# Patient Record
Sex: Male | Born: 1955 | Race: Black or African American | Hispanic: No | Marital: Married | Smoking: Former smoker
Health system: Southern US, Community
[De-identification: ages and names within clinical notes are randomized; demographics above are authoritative.]

## PROBLEM LIST (undated history)

## (undated) ENCOUNTER — Emergency Department (HOSPITAL_COMMUNITY): Admission: EM | Payer: Self-pay | Source: Home / Self Care

## (undated) DIAGNOSIS — F319 Bipolar disorder, unspecified: Secondary | ICD-10-CM

## (undated) DIAGNOSIS — I10 Essential (primary) hypertension: Secondary | ICD-10-CM

## (undated) DIAGNOSIS — I4892 Unspecified atrial flutter: Secondary | ICD-10-CM

## (undated) DIAGNOSIS — E785 Hyperlipidemia, unspecified: Secondary | ICD-10-CM

## (undated) DIAGNOSIS — E119 Type 2 diabetes mellitus without complications: Secondary | ICD-10-CM

## (undated) HISTORY — DX: Hyperlipidemia, unspecified: E78.5

## (undated) HISTORY — PX: OTHER SURGICAL HISTORY: SHX169

## (undated) HISTORY — DX: Essential (primary) hypertension: I10

## (undated) HISTORY — DX: Type 2 diabetes mellitus without complications: E11.9

## (undated) HISTORY — DX: Bipolar disorder, unspecified: F31.9

## (undated) HISTORY — DX: Unspecified atrial flutter: I48.92

---

## 2004-10-30 ENCOUNTER — Emergency Department (HOSPITAL_COMMUNITY): Admission: EM | Admit: 2004-10-30 | Discharge: 2004-10-30 | Payer: Self-pay | Admitting: Emergency Medicine

## 2004-12-14 ENCOUNTER — Inpatient Hospital Stay (HOSPITAL_COMMUNITY): Admission: EM | Admit: 2004-12-14 | Discharge: 2004-12-16 | Payer: Self-pay | Admitting: Emergency Medicine

## 2005-03-13 ENCOUNTER — Inpatient Hospital Stay (HOSPITAL_COMMUNITY): Admission: RE | Admit: 2005-03-13 | Discharge: 2005-03-17 | Payer: Self-pay | Admitting: Psychiatry

## 2005-03-13 ENCOUNTER — Ambulatory Visit: Payer: Self-pay | Admitting: Psychiatry

## 2005-05-13 ENCOUNTER — Emergency Department (HOSPITAL_COMMUNITY): Admission: EM | Admit: 2005-05-13 | Discharge: 2005-05-13 | Payer: Self-pay | Admitting: Emergency Medicine

## 2005-06-02 ENCOUNTER — Emergency Department (HOSPITAL_COMMUNITY): Admission: EM | Admit: 2005-06-02 | Discharge: 2005-06-03 | Payer: Self-pay | Admitting: Emergency Medicine

## 2005-11-19 ENCOUNTER — Emergency Department (HOSPITAL_COMMUNITY): Admission: EM | Admit: 2005-11-19 | Discharge: 2005-11-19 | Payer: Self-pay | Admitting: Emergency Medicine

## 2006-03-19 ENCOUNTER — Emergency Department (HOSPITAL_COMMUNITY): Admission: EM | Admit: 2006-03-19 | Discharge: 2006-03-19 | Payer: Self-pay | Admitting: Emergency Medicine

## 2007-02-22 ENCOUNTER — Emergency Department (HOSPITAL_COMMUNITY): Admission: EM | Admit: 2007-02-22 | Discharge: 2007-02-22 | Payer: Self-pay | Admitting: Emergency Medicine

## 2007-03-03 ENCOUNTER — Emergency Department (HOSPITAL_COMMUNITY): Admission: EM | Admit: 2007-03-03 | Discharge: 2007-03-04 | Payer: Self-pay | Admitting: Emergency Medicine

## 2007-03-04 ENCOUNTER — Inpatient Hospital Stay (HOSPITAL_COMMUNITY): Admission: EM | Admit: 2007-03-04 | Discharge: 2007-03-06 | Payer: Self-pay | Admitting: Emergency Medicine

## 2007-03-05 ENCOUNTER — Ambulatory Visit: Payer: Self-pay | Admitting: *Deleted

## 2007-03-06 ENCOUNTER — Inpatient Hospital Stay (HOSPITAL_COMMUNITY): Admission: AD | Admit: 2007-03-06 | Discharge: 2007-03-21 | Payer: Self-pay | Admitting: *Deleted

## 2007-03-07 ENCOUNTER — Ambulatory Visit: Payer: Self-pay | Admitting: Psychiatry

## 2009-01-28 ENCOUNTER — Inpatient Hospital Stay (HOSPITAL_COMMUNITY): Admission: EM | Admit: 2009-01-28 | Discharge: 2009-01-30 | Payer: Self-pay | Admitting: Emergency Medicine

## 2009-01-29 ENCOUNTER — Encounter (INDEPENDENT_AMBULATORY_CARE_PROVIDER_SITE_OTHER): Payer: Self-pay | Admitting: Internal Medicine

## 2010-04-09 ENCOUNTER — Emergency Department (HOSPITAL_COMMUNITY): Admission: EM | Admit: 2010-04-09 | Discharge: 2010-04-09 | Payer: Self-pay | Admitting: Emergency Medicine

## 2011-02-09 LAB — CBC
HCT: 39.3 % (ref 39.0–52.0)
Hemoglobin: 13.4 g/dL (ref 13.0–17.0)
MCHC: 34.1 g/dL (ref 30.0–36.0)
MCV: 91.8 fL (ref 78.0–100.0)
Platelets: 93 10*3/uL — ABNORMAL LOW (ref 150–400)
RBC: 4.28 MIL/uL (ref 4.22–5.81)
RDW: 13.7 % (ref 11.5–15.5)
WBC: 7.6 10*3/uL (ref 4.0–10.5)

## 2011-02-09 LAB — COMPREHENSIVE METABOLIC PANEL
ALT: 42 U/L (ref 0–53)
AST: 28 U/L (ref 0–37)
Albumin: 3.5 g/dL (ref 3.5–5.2)
Alkaline Phosphatase: 55 U/L (ref 39–117)
BUN: 17 mg/dL (ref 6–23)
CO2: 29 mEq/L (ref 19–32)
Calcium: 8.7 mg/dL (ref 8.4–10.5)
Chloride: 102 mEq/L (ref 96–112)
Creatinine, Ser: 1.71 mg/dL — ABNORMAL HIGH (ref 0.4–1.5)
GFR calc Af Amer: 51 mL/min — ABNORMAL LOW (ref >60)
GFR calc non Af Amer: 42 mL/min — ABNORMAL LOW (ref >60)
Glucose, Bld: 156 mg/dL — ABNORMAL HIGH (ref 70–99)
Potassium: 3.1 mEq/L — ABNORMAL LOW (ref 3.5–5.1)
Sodium: 139 mEq/L (ref 135–145)
Total Bilirubin: 0.9 mg/dL (ref 0.3–1.2)
Total Protein: 6.5 g/dL (ref 6.0–8.3)

## 2011-02-09 LAB — RAPID URINE DRUG SCREEN, HOSP PERFORMED
Amphetamines: NOT DETECTED
Barbiturates: NOT DETECTED
Benzodiazepines: NOT DETECTED
Cocaine: NOT DETECTED
Opiates: NOT DETECTED
Tetrahydrocannabinol: NOT DETECTED

## 2011-02-09 LAB — DIFFERENTIAL
Basophils Absolute: 0 10*3/uL (ref 0.0–0.1)
Basophils Relative: 0 % (ref 0–1)
Eosinophils Absolute: 0 10*3/uL (ref 0.0–0.7)
Eosinophils Relative: 1 % (ref 0–5)
Lymphocytes Relative: 17 % (ref 12–46)
Lymphs Abs: 1.3 10*3/uL (ref 0.7–4.0)
Monocytes Absolute: 0.5 10*3/uL (ref 0.1–1.0)
Monocytes Relative: 7 % (ref 3–12)
Neutro Abs: 5.7 10*3/uL (ref 1.7–7.7)
Neutrophils Relative %: 75 % (ref 43–77)

## 2011-02-09 LAB — URINALYSIS, ROUTINE W REFLEX MICROSCOPIC
Bilirubin Urine: NEGATIVE
Glucose, UA: NEGATIVE mg/dL
Hgb urine dipstick: NEGATIVE
Ketones, ur: NEGATIVE mg/dL
Nitrite: NEGATIVE
Protein, ur: NEGATIVE mg/dL
Specific Gravity, Urine: 1.024 (ref 1.005–1.030)
Urobilinogen, UA: 0.2 mg/dL (ref 0.0–1.0)
pH: 5 (ref 5.0–8.0)

## 2011-02-09 LAB — POCT CARDIAC MARKERS
CKMB, poc: <1 ng/mL — ABNORMAL LOW (ref 1.0–8.0)
CKMB, poc: <1 ng/mL — ABNORMAL LOW (ref 1.0–8.0)
Myoglobin, poc: 158 ng/mL (ref 12–200)
Myoglobin, poc: 176 ng/mL (ref 12–200)
Troponin i, poc: <0.05 ng/mL (ref 0.00–0.09)
Troponin i, poc: <0.05 ng/mL (ref 0.00–0.09)

## 2011-02-09 LAB — ETHANOL: Alcohol, Ethyl (B): <5 mg/dL (ref 0–10)

## 2011-03-05 LAB — CBC
HCT: 34.9 % — ABNORMAL LOW (ref 39.0–52.0)
HCT: 42.7 % (ref 39.0–52.0)
Hemoglobin: 14.8 g/dL (ref 13.0–17.0)
MCHC: 34.5 g/dL (ref 30.0–36.0)
MCHC: 34.7 g/dL (ref 30.0–36.0)
MCV: 89.3 fL (ref 78.0–100.0)
MCV: 89.4 fL (ref 78.0–100.0)
Platelets: 104 10*3/uL — ABNORMAL LOW (ref 150–400)
Platelets: 115 10*3/uL — ABNORMAL LOW (ref 150–400)
RBC: 3.91 MIL/uL — ABNORMAL LOW (ref 4.22–5.81)
RBC: 4.77 MIL/uL (ref 4.22–5.81)
RDW: 13.7 % (ref 11.5–15.5)
WBC: 6.6 10*3/uL (ref 4.0–10.5)

## 2011-03-05 LAB — URINALYSIS, ROUTINE W REFLEX MICROSCOPIC
Nitrite: NEGATIVE
Specific Gravity, Urine: 1.024 (ref 1.005–1.030)
Urobilinogen, UA: 0.2 mg/dL (ref 0.0–1.0)

## 2011-03-05 LAB — COMPREHENSIVE METABOLIC PANEL
ALT: 24 U/L (ref 0–53)
AST: 23 U/L (ref 0–37)
Albumin: 3.7 g/dL (ref 3.5–5.2)
Alkaline Phosphatase: 51 U/L (ref 39–117)
BUN: 17 mg/dL (ref 6–23)
CO2: 27 mEq/L (ref 19–32)
Calcium: 9 mg/dL (ref 8.4–10.5)
Chloride: 105 mEq/L (ref 96–112)
Creatinine, Ser: 1.44 mg/dL (ref 0.4–1.5)
GFR calc Af Amer: >60 mL/min (ref >60)
GFR calc non Af Amer: 51 mL/min — ABNORMAL LOW (ref >60)
Glucose, Bld: 145 mg/dL — ABNORMAL HIGH (ref 70–99)
Potassium: 3.7 mEq/L (ref 3.5–5.1)
Sodium: 140 mEq/L (ref 135–145)
Total Bilirubin: 0.9 mg/dL (ref 0.3–1.2)
Total Protein: 6.7 g/dL (ref 6.0–8.3)

## 2011-03-05 LAB — DIFFERENTIAL
Basophils Absolute: 0 10*3/uL (ref 0.0–0.1)
Basophils Relative: 0 % (ref 0–1)
Eosinophils Absolute: 0 10*3/uL (ref 0.0–0.7)
Eosinophils Relative: 1 % (ref 0–5)
Lymphocytes Relative: 15 % (ref 12–46)
Lymphs Abs: 1 10*3/uL (ref 0.7–4.0)
Monocytes Absolute: 0.3 10*3/uL (ref 0.1–1.0)
Monocytes Relative: 4 % (ref 3–12)
Neutro Abs: 5.2 10*3/uL (ref 1.7–7.7)
Neutrophils Relative %: 80 % — ABNORMAL HIGH (ref 43–77)

## 2011-03-05 LAB — BASIC METABOLIC PANEL
BUN: 12 mg/dL (ref 6–23)
Calcium: 8.7 mg/dL (ref 8.4–10.5)
Creatinine, Ser: 1.26 mg/dL (ref 0.4–1.5)
GFR calc Af Amer: >60 mL/min (ref >60)

## 2011-03-05 LAB — GLUCOSE, CAPILLARY
Glucose-Capillary: 115 mg/dL — ABNORMAL HIGH (ref 70–99)
Glucose-Capillary: 154 mg/dL — ABNORMAL HIGH (ref 70–99)
Glucose-Capillary: 58 mg/dL — ABNORMAL LOW (ref 70–99)
Glucose-Capillary: 89 mg/dL (ref 70–99)
Glucose-Capillary: 91 mg/dL (ref 70–99)
Glucose-Capillary: 99 mg/dL (ref 70–99)

## 2011-03-05 LAB — CARDIAC PANEL(CRET KIN+CKTOT+MB+TROPI)
CK, MB: 2.3 ng/mL (ref 0.3–4.0)
Total CK: 167 U/L (ref 7–232)
Total CK: 182 U/L (ref 7–232)
Troponin I: 0.01 ng/mL (ref 0.00–0.06)
Troponin I: 0.03 ng/mL (ref 0.00–0.06)

## 2011-03-05 LAB — RAPID URINE DRUG SCREEN, HOSP PERFORMED
Barbiturates: NOT DETECTED
Opiates: NOT DETECTED

## 2011-04-07 NOTE — H&P (Signed)
NAMEBRAXDON, GAPPA              ACCOUNT NO.:  [id number]   MEDICAL RECORD NO.:  [id number]          PATIENT TYPE:  INP   LOCATION:  4703                         FACILITY:  [facility]   PHYSICIAN:  [name], M.D.    DATE OF BIRTH:  07/21/1956   DATE OF ADMISSION:  01/28/2009  DATE OF DISCHARGE:                              HISTORY & PHYSICAL   PRESENTING COMPLAINTS:  Poorly responsive.   HISTORY OF PRESENT ILLNESS:  [name] is a 55 year old African  American gentleman with past medical history significant for psychotic  disorder, systemic hypertension, and type 2 diabetes mellitus.  He was  brought in to the emergency room by EMS after his wife found him poorly  responsive in the bathroom in the early hours of the morning of  presentation.  He apparently had his eyes rolled back and did not  respond to name calling for some minutes and then he came around.  There  was no complaints of any chest pain, shortness of breath, or  palpitations.  He did not have any slurring of speech or weakness of  extremities.  There is no report of any seizure activity and he did not  have any vomiting, diarrhea, or abdominal pain.  His sugar levels were  not checked, so he was not sure if he was hypoglycemic, but it was noted  that he had eaten and took his medications, was picked up by EMS.   His vital signs were stable at the emergency room.  He was not in any  acute respiratory or painful distress.  He was alert, oriented, and not  responsive appropriately.  His vital signs are stable.   LABORATORY WORKUP:  CT scan of the head and blood work, all of which  came back within normal limits, however, EKG showed new onset of atrial  fibrillation at a rate of 94 beats per minute, although he was  completely asymptomatic of this.  He was admitted to the hospital to  further evaluate his symptomatology and new findings on EKG.   PAST MEDICAL HISTORY:  1. Psychotic disorder.  2. Type 2 diabetes  mellitus.  3. Systemic hypertension.  4. Anxiety disorder.   MEDICATION HISTORY:  1. He is on NovoLog insulin 70/30, taking 20 units in the a.m. and 15      units in the p.m.  2. Lotrel 5/10 mg 1 p.o. daily.  3. Benicar HCT 40/12.5 once a day.  4. Aspirin 81 mg daily.  5. Amaryl 4 mg daily.  6. Geodon 60 mg 1 p.o. q.a.m. 2 at bedtime.  7. Haldol 5 mg 1 p.o. b.i.d.  8. Depakote ER 1 p.o. at bedtime.   ALLERGIES:  He has no known drug allergies.   FAMILY AND SOCIAL HISTORY:  He is married and lives with his wife.  He  denies any use of alcohol, tobacco, or illicit drugs.  He has 11  siblings, 1 with kidney disease and 1 with diabetes.  There are no  children.  Both of his parents are deceased.  He is disabled from his  medical conditions.  REVIEW OF SYSTEMS:  Essentially as above.   PHYSICAL EXAMINATION:  GENERAL:  He was lying in the hospital bed, not  in acute respiratory or painful distress.  He is not pale.  He is not  icteric.  He is not cyanosed.  VITAL SIGNS:  Blood pressure of 118/68; heart rate of 71, regular;  respiratory rate of 18; temperature 97.7; and O2 sats on room air was  96%.  NECK:  Supple with no elevated JVD.  No cervical lymphadenopathy.  CHEST:  Good air entry bilaterally with no rales, rhonchi, or wheezes.  HEART:  S1 and S2 are heard.  No murmurs.  No S3 gallops.  Regular.  ABDOMEN:  Flat, soft, and nontender.  No masses.  Bowel sounds are  present.  EXTREMITIES:  No edema, calf tenderness, or swelling.  CNS:  He is alert and oriented x3 with no focal neurological deficits.   Sodium is 140, potassium 3.7, bicarbonate of 27, chloride 105, glucose  145, BUN 17, and creatinine 1.44.  Liver function tests within normal  limits.  White count is 6.6, hemoglobin 13.8, hematocrit 42.7, and  platelet count of 115,000.  CT scan of the chest was negative for acute  pulmonary embolism and his lungs are clear.  CT scan of the head is  reported as showing normal  appearance with no malformation, atrophy,  infarct, mass lesion, hemorrhage, or hydrocephalus.  His EKG showed  atrial fibrillation rhythm at a rate of 94 beats per minute.   ADMISSION DIAGNOSES:  1. Presyncope.  2. New-onset atrial fibrillation.  3. Type 2 diabetes mellitus.   PLAN OF CARE:  Admit to telemetry bed.   DIET:  A 2 g sodium carb-modified medium diet.   Vital signs q.4 h.  CBGs a.c and nightly.  Further laboratory data  including a CBC, BMET, EKG in a.m., serial CPK and troponin q. 6. x2  with IV normal saline less than 5 mL an hour, Protonix 40 mg daily for  GI prophylaxis, subcu Lovenox 40 mg daily for DVT prophylaxis,  metoprolol 12.5 mg b.i.d. for rate control.  His home medication will be  continued as above.  Further treatment plan would depend on his response  to the above therapy and results of further evaluation.  A 2-D  echocardiogram will be performed to evaluate cardiac muscle, valves, and  ejection fraction.  This plan of care has been discussed with him and  his wife, and their questions were answered.      [name], M.D.  Electronically Signed     EA/MEDQ  D:  01/29/2009  T:  01/30/2009  Job:  [id number]

## 2011-04-10 NOTE — Discharge Summary (Signed)
NAMEGEROD, [name]              ACCOUNT NO.:  [id number]   MEDICAL RECORD NO.:  [id number]          PATIENT TYPE:  INP   LOCATION:  0355                         FACILITY:  [facility]   PHYSICIAN:  Michaelyn Barter, M.D. DATE OF BIRTH:  05/06/1956   DATE OF ADMISSION:  12/12/2004  DATE OF DISCHARGE:  12/16/2004                                 DISCHARGE SUMMARY   PRIMARY CARE PHYSICIAN:  Robyn N. Allyne Gee, M.D.   FINAL DIAGNOSES:  1.  Psychotic disorder not otherwise specified.  2.  Renal insufficiency.  3.  Uncontrolled hypertension.   CONSULTATION:  Psychiatry, Antonietta Breach, M.D.   HISTORY OF PRESENT ILLNESS:  [name] is a 55 year old gentlemen with a  past medical history of psychiatric problems and diabetes mellitus who was  admitted secondary to a chief complaint of confusion, hallucinations and  agitation.  At the time of admission his wife provided the history.  She  stated that the patient had been stressed secondary to the death of his  brother which occurred four months earlier.  He had not been able to work  over the previous two to three weeks.  He had been experiencing daily  hallucinations.  In addition, he has become more confused and agitated,  talking to himself and other individuals who are not present in the room.  At times, he remained awake throughout all hours of the night and appeared  to be suffering from sleep deprivation.  The patient had recently been  started on Xanax 0.5 mg q.h.s. and Benicar 40 mg daily by his PCP, Dr.  Allyne Gee.  There were complaints of fevers, chills, nausea, vomiting, or  diarrhea.  No chest pain or shortness of breath.   PAST MEDICAL HISTORY:  1.  Psychotic episode in the 1990s at which time the patient was      hospitalized by at [facility] for approximately three months.  2.  Type 2 diabetes mellitus diagnosed approximately 10 years ago.  3.  Hypertension.  4.  Anxiety.   ALLERGIES:  No known drug allergies.   SOCIAL  HISTORY:  Cigarettes:  Denies.  Alcohol:  Denies.   FAMILY HISTORY:  Mother deceased at the age of 63 from complications  secondary to diabetes mellitus.  Father deceased, cause of death unknown.  Brother died at age 88 from complications of congestive heart failure.   HOSPITAL COURSE:  The patient was initially suppose to transfer to [facility], however, his lab results revealed a BUN of 28 and a creatinine of  1.8.  Therefore, the patient was admitted for medical clearance with a  tentative plan to later transfer to [facility].   The patient was subsequently admitted to the medicine floor.  His  antihypertensive medications were initially stopped.  He was provided with  aggressive IV fluid hydration initially with normal saline at a rate of 200  mL an hour which was subsequently decreased to a rate of 100 mL an hour.   PROBLEM #1 -  PSYCHOSIS:  Appeared to improve over the course of his  hospitalization.  He did not display overt  signs of psychotic behavior over  the course of his hospitalization.  Psychiatry was consulted and again Dr.  Jeanie Sewer was the physician who responded.  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  He concluded that the patient was no longer at  risk for passive neglect, harming himself or others and he and the patient  made a plan that the patient should dial 911 if those problems did arise.  He recommended that the patient be continued on Zyprexa 5 mg q.h.s. and that  the manage care arrange for the patient to follow up as an outpatient with  the [facility] psychiatric outpatient facilities.   PROBLEM #2 -  UNCONTROLLED HYPERTENSION:  The patient's blood pressure was  elevated over the course of his hospitalization.  This was most likely  secondary to the stopping of his antihypertensive medications which were  done secondary to his creatinine  being elevated.  It was not clear which  medication precipitated the elevation of his creatinine, therefore neither  medication was initially started but instead, the patient was started on  Norvasc, 2.5 mg initially; however, his blood pressure remained elevated  with that dose, therefore, the dose was titrated up to 7.5 mg p.o. daily. At  the date of discharge, the patient's blood pressure was still elevated,  however, the patient stated that he had a follow-up appointment to see Dr.  Candyce Churn. Sanders the same day of discharge.  Therefore, further management  of that could be achieved during that appointment.   PROBLEM #3 -  RENAL INSUFFICIENCY:  This most likely was precipitated by the  patient's decreased oral consumption of fluids over the few days that lead  to his current hospitalization.  Likewise, this decreased oral consumption  of fluids in the presence of hydrochlorothiazide more than likely  precipitated his elevation of creatinine.  This is supported by the fact  that the patient's creatinine dropped to normal levels with IV fluid  hydration.  The patient was also started, however, on Benicar by his PCP  approximately two days prior to this admission.  There was some  consideration that the Benicar could have also precipitated this event,  however, it is questionable that the patient would have had such a  remarkable normalization of his creatinine if this were the case.  Again,  the patient will follow up with his PCP for further evaluation of his  situation.   PROBLEM #4 -  DIABETES MELLITUS:  The patient was continued on sliding scale  insulin and  Accu-Checks were performed over the course of his  hospitalization.  His sugars remained in the normal to slightly elevated  range.   CONDITION ON DISCHARGE:  Improved.   The patient will be discharged home today on the following medications:  1.  Norvasc 2.5 mg three tablets p.o. daily. 2.  Zyprexa 5 mg one tablet  q.h.s.   He has been instructed to follow up with his primary care physician, Dr.  Dorothyann Peng, today.  The appointment had been previously scheduled at  approximately 2 o'clock p.m. today.  At that time, there can be further  adjustments  in his blood pressure medications.  He is also instructed to follow up with  [facility] on Friday, December 19, 2004, at 12:30 p.m.  with Dr. Mikey Bussing.  The phone number there is [phone].  He is  to report to  Intake located at [address].      OR/MEDQ  D:  12/16/2004  T:  12/16/2004  Job:  [id number]   cc:   Candyce Churn. Allyne Gee, M.D.  [address]  [address]  [place]  [place] [address]  Fax: [phone]

## 2011-04-10 NOTE — Discharge Summary (Signed)
[name], FRANSSEN NO.:  [id number]   MEDICAL RECORD NO.:  [id number]          PATIENT TYPE:  IPS   LOCATION:  0402                          [facility]ACILITY[facility]  [facility]   PHYSICIAN:  Jasmine Pang, M.D. DATE OF BIRTH:  28-May-1956   DATE OF ADMISSION:  03/06/2007  DATE OF DISCHARGE:  03/21/2007                               DISCHARGE SUMMARY   IDENTIFICATION:  This is a 55 year old African-American male who was  married.  He was admitted on a voluntary basis on March 06, 2007.   HISTORY OF PRESENT ILLNESS:  This patient was transferred to [facility] after an admission to the medical unit on April 11.  He  had been brought to the emergency room by his wife for increasing  problems with confusion and agitation.  He was admitted to the medical  unit for stabilization of his diabetes and hypertension, and was  restarted on his room routine medications by his primary care physician.  Psychiatric consult was done.  The patient was restarted on Risperdal 1  mg twice daily.  He had been off psychotropic meds for an unknown period  of time.  He was unwilling to give history today.  Affect was guarded.  He has been redirectable.  There are no clear suicidal or homicidal  thoughts expressed.  The patient was fairly catatonic when seen by the  psychiatrist in the hospital.  The patient was previously followed at  [facility].  He is not currently under the  care of any psychiatrist.  This is his third admission to [facility] with his last admission being March 13, 2005  through March 17, 2005, under the care of Dr. Geoffery Lyons.  He has a  history of prior psychotic illness with agitation and at times some  bizarre behavior, and diagnosed in the past with psychotic disorder NOS.  Possible bipolar disorder with psychotic features.  He was previously  stabilized on Risperdal.  He has a history of reported substance  abuse  with the last known use prior to this admission being 80.  His urine  drug screen was positive for marijuana and benzodiazepines.  The patient  has diabetes mellitus type 2, hypertension.  He also has a past medical  history of osteoarthritis and chronic renal insufficiency which is  currently under control.  He is on Risperdal 1 mg p.o. b.i.d., Ativan 1  mg q.4 hours p.r.n. agitation or withdrawal, NovoLog 70/30 insulin 10  units q.a.m. and 5 units q.p.m., Amaryl 4 mg daily, Lotrel 5/10 once  daily, Benicar HCT 20/12.5 mg once daily, Protonix 40 mg daily.  He had  no known drug allergies.   PHYSICAL FINDINGS:  The patient was a well-nourished, well-developed  male in no acute distress.  His physical exam was done while in the  hospital and [facility].   ADMISSION LABORATORY:  CBC was grossly within normal limits except for a  low hematocrit of 38.2 (39 to 52) and a low RBC count of 4.15 (4.22 to  5.81).  Neutrophil count was  high at 81 (43 to 77).  Glucose is 200 (70  to 99).  Routine chemistry panel was within normal limits other than the  elevated glucose.  An ammonia level was within normal range.  TSH was  1.038 which was within normal limits.  Acetaminophen level was less than  10.  Salicylate was less than 4.  Urine drug screen was positive for  benzodiazepines and for marijuana.   HOSPITAL COURSE:  Upon admission, patient was started on Risperdal M tab  1 mg now and Ativan 2 mg p.o. or IM now.  He was started on Risperdal M  tab 1 mg p.o. b.i.d., Ativan 1 mg p.o. q.4 hours p.r.n. agitation,  NovoLog 70/30 mixed 10 units in the a.m. and  5 units in the p.m.,  Amaryl 4 mg p.o. daily, Lotrel 5/10 p.o. daily, Benicar HCT 20/12.5 mg  once daily, Protonix 40 mg daily.  On March 07, 2007, Risperdal M tab  was changed to 1 mg in the morning and 1 mg at h.s. instead of b.i.d.,  Cogentin 2 mg q.8 hour p.r.n. EPS akathisia was started.  The 70/30  insulin was discontinued.  He  was to have no basal insulin since he was  not eating very well.  He was placed on a sliding scale insulin dose.  Risperdal 1 mg p.o. q.6 hours, M-Tab p.r.n. agitation was started,  Ambien 5 mg p.o. nightly may repeat x1 was started.  On March 08, 2007,  Risperdal M-Tab was increased to 1 mg p.o. q.a.m. and 3 mg p.o. nightly.  On March 09, 2007, Risperdal M-Tab was increased to 1 mg p.o. b.i.d. and  3 mg p.o. nightly.  On March 10, 2007, Risperdal was discontinued and  Geodon 80 mg p.o. b.i.d. with meals was started.  On March 12, 2007, the  patient was started on Ensure p.o. q.i.d.  On March 14, 2007, Geodon was  increased to 80 mg in the morning and 120 mg at h.s.  On March 16, 2007,  Haldol 5 mg p.o. b.i.d. was begun.  On March 16, 2007, Depakote ER 500  mg p.o. nightly was started due to reports that he had a history of  bipolar disorder.  The a.m. Depakote level done on March 19, 2007 was  27.5 (50 to 100).   Upon first meeting with the patient, he was catatonic.  He would not  talk with me.  He stared straight ahead.  His mental status continued  like this for the following week.  Risperdal was slowly increased as  indicated above, but on March 10, 2007, this was discontinued and he was  changed to Geodon 80 mg p.o. b.i.d.  On March 11, 2007, the patient was  sitting in the chair.  He was somewhat catatonic but he did tell me he  knew my name was Dr. Katrinka Blazing.  He said very still as I attempted to talk  with him.  His wife came daily to help feed him and he took food well  from her.  On March 12, 2007, the patient had the same mental status.  On March 13, 2007, the patient had good eye contact.  He was lying in  bed.  He spoke with me today.  He states he is not sleeping well.  He  did not eat breakfast.  He was not sure if his wife was going to visit  today.  He was less psychomotor retarded, but still lying in bed.  He  did not want to get up for lunch.  In the next few days the  patient's mental status was essentially unchanged.  He continued to have better  eye contact.  He was able to speak.  He was eating breakfast.  Wife was  still visiting him frequently.  He was still incontinent and wearing a  diaper, but he was assisted to the toilet every 2 hours and was able to  no longer be incontinent.  On March 17, 2007, the patient was taking his  medications without problem.  He was eating and toileting appropriately.  On April 25,2008, the patient had gotten out of bed to eat meals and ate  lunch in the cafeteria with his wife.  There was still some psychomotor  retardation.  He was toileting on his own.  Wife felt he would be ready  to go home after the weekend.  She agreed with a Monday March 21, 2007  discharged.  On April 26 and March 20, 2007, patient's mental status  continued to be improved.  He was alert and cooperative.  He was up and  visible on the unit.  He was eating meals.  Speech was more spontaneous.  He reports he slept well.   On March 21, 2007, mental status had improved markedly from admission  status.  The patient had good eye contact.  Speech was normal rate and  flow.  He was more conversant, cooperative.  Psychomotor retardation was  still present to some extent, but not as severe as had been upon  admission when he was catatonic.  The mood was euthymic.  Affect still  somewhat constricted but able to express some range.  There was no  suicidal or homicidal ideation.  No thoughts of self injurious behavior.  No auditory or visual hallucinations.  No paranoia or delusions.  Thoughts were logical and goal-directed.  Thought content no predominant  theme.  Cognitive was back to baseline.  The patient was no longer  catatonic.   DISCHARGE DIAGNOSES:  AXIS I:  Bipolar disorder, recurrent severe with  psychotic features.  History of substance abuse.  Current marijuana  abuse. AXIS II:  None.  AXIS III:  Diabetes mellitus type 2, controlled;  hypertension; history  of osteoarthritis; history of chronic renal insufficiency which is well  controlled.  AXIS IV:  Moderate (burden of psychiatric illness, burden of medical  illness, psychosocial difficulties related to his illness).  AXIS V:  Global assessment of functioning upon discharge was 48.  Global  assessment of functioning upon admission was 30.  Global assessment of  functioning highest past year was 64.   DISCHARGE/PLAN:  There were no specific activity level or dietary  restrictions.   DISCHARGE MEDICATIONS:  1. Amaryl 4 mg daily at 7:00 a.m. before meal.  2. Protonix 40 mg daily at 8:00 a.m. with breakfast.  3. Benicar HCT 20/12.5 daily.  4. Lotrel 05/10 daily.  5. Geodon 60 mg in the morning and 120 mg at dinner (this was      decreased from 80 mg in the morning).  6. Haldol 5 mg twice daily.  7. Depakote ER 500 mg p.o. nightly.  A.M. Depakote level was 27.5 (50      to 100).  8. Ambien 10 mg p.o. nightly.  POST HOSPITAL CARE PLANS:  The patient is to go to the [facility]  to see Dr. Mikey Bussing on March 22, 2007 at 3:15 p.m.  The patient is also  to see  his primary care practitioner, Dr. [name], for his  diabetes and hypertension.      Jasmine Pang, M.D.  Electronically Signed     [facility]/MEDQ  D:  03/21/2007  T:  03/21/2007  Job:  [id number]

## 2011-04-10 NOTE — Discharge Summary (Signed)
NAMEDEVYNN, SCHEFF              ACCOUNT NO.:  [id number]   MEDICAL RECORD NO.:  [id number]          PATIENT TYPE:  INP   LOCATION:  1320                         FACILITY:  [facility]   PHYSICIAN:  [name], M.D.    DATE OF BIRTH:  Aug 10, 1956   DATE OF ADMISSION:  03/04/2007  DATE OF DISCHARGE:  03/08/2007                               DISCHARGE SUMMARY   HISTORY OF PRESENT ILLNESS:  Mr. Willhite is a 55 year old [place]-American  gentleman with past medical history significant for psychotic disorder,  bipolar, type 2 diabetes mellitus and hypertension.  He was admitted via  the emergency room at [facility], after requiring visits to  the ED, brought in by his wife with complaints of confusion, agitation,  restlessness, turmoil, poor sleep and not answering questions  appropriately.  He had been on medications for psychosis in the past but  have been off his medications for a couple of years and had been  drinking alcohol and smoking marijuana.  He was becoming increasingly  difficult for the wife to cope with him at home; therefore requested  that he be admitted for further evaluation and treatment.  As a result  of his comorbid medical conditions, he was admitted under my service to  stabilize his diabetes and hypertension and possible transfer to the  [facility].   HOSPITAL COURSE:  On admission, patient looked anxious and kind of  suspicious.  He was not in any acute respiratory or painful distress.  His vital signs were stable.  His blood pressure was elevated at 208/96,  heart rate of 106, regular respiratory rate of 24.  Temperature was 99,  O2 SATs on room air was 99%.  His weight was 73.7 kg with a height of 70  inches.  His other hospital physical exam essentially was normal, except  in the psychiatric exam his affect was flat.  He was not answering  questions but had to look to his wife, for he answered in short  syllables.  He was not making full  eye contact.  I could not say if he  had any hallucinations or delusions.   LABORATORY:  His initial laboratory data revealed  normal CBC, CMET and  a CT scan of his head.  His ammonia levels were 16.  Drug screen was  negative, except for benzodiazepines, cannabinoids in his urine.   ADMISSION DIAGNOSES:  1. Psychotic disorder.  2. Uncontrolled diabetes mellitus.  3. Uncontrolled hypertension, all due to poor compliance with physical      therapy.   On admission, patient was placed on a medical bed.  He was started back  on his usual medications of Amaryl, NovoLog 70/30 and mixed insulin,  Benicar HCT and Lotrel.  At time, patient did not take his medications  as recommended, except if his wife is available to coerce him and  requires him to take it.   Psychiatric consult was requested and patient was kindly seen by Dr.  Dub Mikes, who resumed him back on his Risperdal 1 mg b.i.d., as well as  Ativan 1 mg q.4 h. p.r.n.  for agitation.   EXAMINATION:  GENERAL:  Today, he seen lying in bed, not in acute  respiratory or painful distress.  He still looks quite anxious and  suspicious, again not responding to questioning appropriately.  VITAL SIGNS:  Shows a blood pressure of 160/90, heart rate of 98  regular.  CHEST:  Clear to auscultation, heart sounds 1/2 are heard.  ABDOMEN:  Benign.  Bowel sounds are present.  EXTREMITIES:  Shows no edema, no calf tenderness or swelling.  CNS:  Alert and oriented.  There are no focal neurological deficits.   ASSESSMENT:  Mr. Lemmons has a acute exacerbation of his chronic  psychotic disorder.  His medical conditions are currently stable, then  they get maybe aggravated by his psychotic condition.  I believe that he  will be well served or better managed for his psychiatric condition at  the [facility].   __________  stable for discharge to the [facility] today.   DISCHARGE DIAGNOSES:  1. Psychotic disorder.  2.  Polysubstance abuse.  3. Type 2 diabetes mellitus, moderately controlled.  4. Hypertension, moderately controlled.   DISCHARGE MEDICATIONS:  1. Risperdal 1 mg b.i.d.  2. Ativan 1 mg q.4 h. p.r.n. for agitation.  3. NovoLog 70/30 mixed 10 units a.m., 5 units p.m.  4. Amaryl 4 mg daily.  5. Lotrel 5/10 mg once a day.  6. Benicar HCT 20/12.5 mg once a day.  7. Protonix 40 mg daily.   His plan of care has been discussed with him and his wife and their  questions answered.   DISPOSITION:  [facility].  Condition on discharge is  stable.      [name], M.D.  Electronically Signed     EA/MEDQ  D:  03/06/2007  T:  03/06/2007  Job:  [id number]

## 2011-04-10 NOTE — H&P (Signed)
NAMETHAYDEN, [name] NO.:  [id number]   MEDICAL RECORD NO.:  [id number]          PATIENT TYPE:  EMS   LOCATION:  ED                           FACILITY:  [name]   PHYSICIAN:  [name], M.D.    DATE OF BIRTH:  December 08, 1955   DATE OF ADMISSION:  12/12/2004  DATE OF DISCHARGE:                                HISTORY & PHYSICAL   PRIMARY CARE PHYSICIAN:  [name].   CHIEF COMPLAINT:  Confusion, hallucinations, agitation.   HISTORY OF PRESENT ILLNESS:  [name] is a 55 year old man with a past  medical history significant for diabetes mellitus, hypertension, and a 3-  month hospitalization at [name] in the 1990s for psychosis who  presents to the [facility] with a 3- to 4-day history of confusion, agitation,  visual hallucinations, and lack of sleep.  Most of the history is provided  by the patient's wife, [name].  The patient has been stressed out  since his brother died 4 months ago.  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  He has had daily episodes of visual  hallucinations per his wife.  He has become confused and agitated, often  talking to himself and talking to other individuals who are not in the room.  The patient has not been verbally or physically abusive to his wife.  At  times the patient is up all night pacing the room.  He has probably had only  a total of 6 or 7 hours of sleep over the past 4-5 nights.  There has been  no episodes of low blood sugars or very high blood sugars.  The patient  denies any alcohol, illicit drug use, or tobacco use.  The only new  medications have been Xanax 0.5 at bedtime and Benicar 40 mg daily  prescribed by his relatively new primary care physician, [name].  The  patient has had no recent fever, chills, nausea, vomiting, or diarrhea.  His  appetite has been fair to poor.  He denies any abdominal pain.  No chest  pain, no shortness of breath.  No headaches,  no visual changes.  The patient  had a similar presentation in the 1990s which required a 48-month  hospitalization at [name].  When asked, neither the patient nor his  wife were clear on the diagnoses that he was given at the time.   The patient was evaluated by the behavioral health team in the emergency  department.  The tentative diagnosis was psychosis.  It was felt by the  behavioral health staff that the patient was appropriate for transfer to  [name].  However, the representatives at [name] did not accept the patient because of his elevated BUN of 28 and  creatinine of 1.8.  The patient will therefore be admitted for 23-hour  observation for medical clearance and hopefully transfer to [name]  later on in the day.  His renal function tests will be reassessed after IV  fluids.   PAST MEDICAL HISTORY:  1.  History of some type of psychotic behavior in the 1990s, hospitalized at      [name] for 3 months.  Exact diagnosis unknown.  2.  Type 2 diabetes mellitus, diagnosed approximately 10 years ago.  3.  Hypertension.  4.  Anxiety.   MEDICATIONS:  1.  Insulin 70/30 15 units b.i.d.  2.  Xanax 0.5 mg q.h.s. (recently started).  3.  Hydrochlorothiazide 25 mg daily.  4.  Benicar 40 mg daily (started 2 days ago).  5.  Aspirin 81 mg daily.  6.  Robitussin DM two teaspoons q.6h. as needed (the patient has not used in      2 weeks).   ALLERGIES:  No known drug allergies.   SOCIAL HISTORY:  The patient is married and has been married 11 years.  He  and his wife live in [place], [place].  No children.  He denies  alcohol, tobacco, and drug use.  He is employed as a Education administrator.   FAMILY HISTORY:  His mother died at ? 7 years of age secondary to  complications from diabetes mellitus.  His father is also deceased, age  unknown, cause unknown.  His brother died approximately 3-4 months ago at 56  years of age secondary to congestive  heart failure with a history of  diabetes and hypertension.   REVIEW OF SYSTEMS:  The patient's review of systems is otherwise negative.   PHYSICAL EXAMINATION:  VITAL SIGNS:  Temperature 98.1, blood pressure  110/64, pulse 73, respiratory rate 18, oxygen saturation 99% on room air.  GENERAL:  The patient is a well-built, muscular 55 year old African-American  man who is currently lying in bed in no acute distress.  HEENT:  Head is normocephalic, nontraumatic.  Pupils are equally round and  react to light.  Extraocular movements are intact.  Conjunctivae are clear,  sclerae are white.  Tympanic membranes are clear bilaterally.  Nasal mucosa  is mildly dry.  No drainage, no sinus tenderness.  Oropharynx reveals mildly  dry mucous membranes, no posterior exudates or erythema.  NECK:  Supple, no thyromegaly, no bruit, no JVD, no adenopathy.  LUNGS:  Clear to auscultation bilaterally.  Breathing is nonlabored.  HEART:  S1, S2 with no murmurs, rubs, or gallops.  ABDOMEN:  Positive bowel sounds, soft, nontender, nondistended.  No  hepatosplenomegaly.  EXTREMITIES:  No acute joint abnormalities.  Pedal pulses are 2+  bilaterally.  No pretibial edema, no pedal edema.  NEUROLOGIC:  The patient is alert and oriented to himself, his wife, and the  [facility].  He is not oriented to the year, the date, the President, the city  or the state.  His speech appears appropriate, no dysarthria.  Cranial  nerves II-XII are intact.  Strength is 5/5 throughout, sensation is intact.  PSYCHOLOGICAL:  The patient's affect is flat.  He tries to answer questions;  however, he looks to his wife for virtually every answer.  He sometimes has  evidence of answering other questions that were not asked.  No evidence of  visual or auditory hallucinations during the exam.  The patient denies  visual hallucinations.  ADMISSION LABORATORY DATA:  CT scan of the head revealed no acute disease.  WBC 9.0, hemoglobin 13.9,  hematocrit 40.8, platelets 183.  Alcohol 7.  Sodium 137, potassium 3.4, chloride 108, CO2 22, glucose 154, BUN 28,  creatinine 1.8, calcium 9.1.  Urine drug screen was positive for  benzodiazepines; otherwise negative.   ASSESSMENT:  1.  Psychotic behavior with delirium.  The patient has a psychiatric      history, exact diagnosis is unknown.  Question possible bipolar disorder      with psychosis versus schizophrenia.  The patient was somewhat combative      earlier in the emergency department but was given Geodon and has now      calmed down some.  2.  Mild azotemia with a BUN of 28 and a creatinine of 1.8.  The patient was      seen in the emergency department in December 2005.  At that time his BUN      was 14 and his creatinine was 1.3.  The patient has been prescribed      hydrochlorothiazide and Benicar for blood pressure control.  These      medications may have contributed to the mild azotemia.  Also, the      patient probably has not hydrated himself over the past few days      secondary to lack of sleep and psychosis.  3.  Hypokalemia.  The patient's hypokalemia is probably secondary to the      hydrochlorothiazide.   PLAN:  1.  The patient will be admitted for 23-hour observation.  2.  Will treat the patient with IV fluids x1 L and then 100 mL/hour for      volume repletion.  3.  Will reassess the patient's renal function panel later on today.  If the      renal function panel is within normal limits or shows some improvement,      will reconsult psychiatry for transfer for psychiatric treatment and      management.  4.  Will replete potassium by mouth and in IV fluids.  5.  Will assess the patient's TSH, RPR, and vitamin B12.  6.  Zyprexa 5 mg daily - dose now.  Will also treat with as-needed Geodon      and Ativan for increase in agitation.  7.  Neurologic checks q.4h.  8.  Cover blood sugars with a sliding scale insulin regimen.     Deni   DF/MEDQ  D:   12/13/2004  T:  12/13/2004  Job:  [id number]   cc:   Candyce Churn. Allyne [facility], M.D.  [address]  [address]  [place]  [place] [address]  Fax: [phone]
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
## 2011-04-10 NOTE — H&P (Signed)
[name]              ACCOUNT NO.:  [id number]   MEDICAL RECORD NO.:  [id number]          PATIENT TYPE:  INP   LOCATION:  1320                         FACILITY:  [facility]   PHYSICIAN:  [name], M.D.    DATE OF BIRTH:  Nov 04, 1956   DATE OF ADMISSION:  03/04/2007  DATE OF DISCHARGE:                              HISTORY & PHYSICAL   PRESENTING COMPLAINT:  Tremulous, confusion and agitation.   HISTORY OF PRESENT ILLNESS:  [name] is a 55-year African American  gentleman with past medical history significant for type 2 diabetes  mellitus, hypertension, bipolar disorder, psychosis.  He presented to  the emergency room three times in the last 2 weeks with the wife  complaining of recurrent episodes of confusion, poor sleep,  unresponsiveness and tremulousness and agitation.  He had been seen in  the emergency room a couple of times, thought to have a flare of his  psychotic disorder and had been referred to mental health, but he had  not made it to these appointments yet.  He had been treated with  antipsychotic medications concluding Zyprexa, Seroquel and Risperdal in  the past but apparently stopped taking all his medications about 2 years  ago and has been drinking alcohol heavily and also using marijuana on  and off.  He was seen in my office about 10 days ago and restarted back  on his Seroquel as well as Ativan and advised to follow up with a  psychiatrist, but again he did not make it to this appointment and he  presents back to the hospital again with recurrent lapses in  responsiveness where just stares and becomes very tremulous in his arms.  He does not pass out.  He does not fall.  He does not have any foaming  of his mouth.  He does not have any biting of his tongue or  incontinence.  His wife states that he then breaks out of this and  becomes responsive again and talks to her, but he has been having these  episodes on and off.  The wife stated that when he  was taking his Ativan  and Seroquel, it was making him excessively sleepy and when he does not  take it, he seems to be unable to sleep, pacing the room, and cannot  keep him still.  He has to have somebody with him at home all the time  to keep an eye on him and monitor him, and the wife, being a full-time  student, cannot continue to care for him at home.  Review of his old  records shows that he had a similar presentation to the hospital in  January 2006 with confusion, hallucinations and agitation, and this was  thought again to be due to his exacerbation of psychosis.  I was called  to admit him for medical clearance prior to evaluation by the  psychiatric team, which I think he will need to stabilize him.   PAST MEDICAL HISTORY:  1. 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  He has been admitted at [facility]  in the past.  2. Type 2 diabetes mellitus diagnosed about 12 years ago.  3. Systemic hypertension.  4. Anxiety disorder.   MEDICATION HISTORY:  1. He is on NovoLog insulin 70/30 10 units b.i.d.  2. He is on Benicar 20/12.5 mg once a day.  3. Lotrel 5/10 mg once daily.  4. Aspirin 81 mg daily.  5. Ambien 10 mg q.h.s.  6. Seroquel 100 mg q.h.s.  7. Ativan 1 mg b.i.d. p.r.n. for agitation.   ALLERGIES:  He has no known drug allergies.   SOCIAL HISTORY:  He is married and lives with his wife here in  [place].  He has no children.  He is he is a Education administrator by trade but had  not been able to work for many years because of his symptoms and is he  virtually disabled.   FAMILY HISTORY:  His parents are both deceased.  His mother had  diabetes.  His brother died about 2 years ago from congestive heart  failure and had a history of hypertension and diabetes.   REVIEW OF SYSTEMS:  Unable to obtain from the patient on.   PHYSICAL EXAMINATION:  GENERAL:  He is lying comfortably on the hospital  bed, not in acute respiratory or painful distress.  He seems to move his   legs now and again, but he is not tremulous or having any seizures.  He  is alert and seems to be oriented to himself.  He is unable to answer  questions related to other orientation to time or place.  He is not  pale.  He is not icteric.  He is not cyanosed.  He is well-hydrated.  VITAL SIGNS:  His blood pressure on admission was 208/96, heart rate 106  and regular, respiratory rate of 24, temperature 99, O2 saturation on  room air is 99%.  His weight is 73.7 kg, height of 70 inches.  HEENT:  His pupils are equal and reactive to light and accommodation.  NECK:  Supple with no elevated JVD.  No carotid bruit.  No cervical  lymphadenopathy.  CHEST:  Good air entry bilaterally with no rales, rhonchi or wheezes.  CARDIAC:  Heart sounds S1 and S2 are heard with no murmurs, S3 gallops.  ABDOMEN:  Flat, soft, nontender, no masses.  Bowel sounds are present.  EXTREMITIES:  No edema.  No calf tenderness or swelling.  CENTRAL NERVOUS SYSTEM:  He is alert and oriented to himself.  The  cranial nerves II-XII are intact.  He is moving all extremities  normally.  There is no focal neurological deficit.  PSYCHIATRIC:  His affect is flat.  He looks to his wife to try and  answer questions but only answers in short syllables.  He is unable to  maintain full eye contact.  I cannot say if he is having any  hallucinations or delusions.   LABORATORY DATA:  A CT scan of his head on March 03, 2007, was normal.  Laboratory data shows a white count of 7.7, hemoglobin 13.4, hematocrit  39, platelet count of 467.  PT was 13.5, INR 1.0, PTT 20.  Sodium was  143, potassium 3.9, chloride 107, bicarbonate 27, BUN is 21, creatinine  1.43, glucose is 233.  His liver function test performed on March 03, 2007, essentially was normal except for albumin of 3.2.  Ammonia level  was 16.  Tylenol and salicylate levels, alcohol levels were within normal limits.  Urine drug screen was positive for benzodiazepines and  cannabinoids.  Urinalysis was positive for glucose, bilirubin, ketones,  blood and protein.   ASSESSMENT:  [name] is a 55 year old African American  gentleman with past medical history significant for hypertension,  diabetes, psychotic disorder, question schizophrenia/slash bipolar  affective disorder.  Presented to the emergency room with some form of a  personality change in the form of drug and substance abuse, staring  episodes and tremulousness, which is atypical for him.  He has been off  his psychiatric medications for several years and his condition seemed  to have deteriorated.  His medical conditions are currently  uncontrolled, most likely due to poor compliance, but he has not been  seen by his psychiatrist for many years and I suspect he needs to be  back on his medications.   ADMISSION DIAGNOSES:  1. Personality disorder.  2. Psychotic disorder.  3. Polysubstance abuse.  4. Uncontrolled type 2 diabetes mellitus.  5. Uncontrolled hypertension.   PLAN OF CARE:  The patient will be admitted to a medical bed.  He will  be on a 3 g sodium, carb-modified diet.  CBGs will checked a.c. and h.s.  and covered with sliding scale NovoLog insulin per moderately sensitive  protocol.  He will be on IV fluids with normal saline at 55 mL/hr.,  Protonix 40 mg daily, Ativan 1-2 mg q.4h. p.r.n. IV for agitation,  Risperdal 1 mg b.i.d.  Further laboratory tests will include an EKG and  a TSH.  Psychiatric consult requested in the a.m. for evaluation and  appropriate therapy once he is medically stable.  This plan of care has  been discussed with him and his wife and their questions were answered.      [name], M.D.  Electronically Signed     EA/MEDQ  D:  03/05/2007  T:  03/05/2007  Job:  [id number]

## 2011-04-10 NOTE — H&P (Signed)
NAMEMARILYN, [name] NO.:  [id number]   MEDICAL RECORD NO.:  [id number]          PATIENT TYPE:  IPS   LOCATION:  0402                          FACILITY:  [facility]   PHYSICIAN:  [name], M.D. DATE OF BIRTH:  1955/12/31   DATE OF ADMISSION:  03/06/2007  DATE OF DISCHARGE:                       PSYCHIATRIC ADMISSION ASSESSMENT   IDENTIFICATION:  This a 55 year old African-American male who is  married.  This is a voluntary admission.   HISTORY OF PRESENT ILLNESS:  This patient was transferred to behavioral  health center after an admission to the medical unit on April 11, after  being brought to the emergency room by his wife for increasing problems  with confusion and agitation.  He was admitted to the medical unit for  stabilization of his diabetes and hypertension, and was restarted on his  routine medications by his primary care physician.  Psychiatric consult  was done.  The patient was restarted on his Risperdal 1 mg twice daily.  He has been off his psychotropic medications for an unknown period of  time.  He is unwilling to give any history today.  Affect is guarded.  He has been directable.  No clear suicidal or homicidal thoughts  expressed.   PAST PSYCHIATRIC HISTORY:  The patient was previously followed at  [facility].  He is not currently under the care of  any psychiatrist.  This is his third admission to [facility], with his last admission being March 13, 2005, through  March 17, 2005, under the care of Dr. Geoffery Flores.  He has a history of  prior psychotic illness with agitation and at times some bizarre  behavior, and diagnosed in the past with psychotic disorder NOS,  possible bipolar disorder with psychotic features.  Previously  stabilized on Risperdal.  He has a history of reported substance abuse  with last known use prior to this admission being 42.  His urine drug  screen was positive for  marijuana and benzodiazepines.   SOCIAL HISTORY:  Unemployed African-American male who is married.  Wife's name is [name].  He is currently unemployed.  He has a  history of a psychosocial stressors in the past.  Precipitating  admissions including financial issues with unemployment and bills piling  up.  Any current stressors are unclear.   FAMILY HISTORY:  Not available.   MEDICAL HISTORY:  The patient is followed by [name], M.D., his  primary care practitioner.  Medical problems are:  1. Diabetes mellitus type 2.  2. Hypertension.  3. He also has a past medical history of osteoarthritis.  4. Chronic renal insufficiency which is currently under control.   CURRENT MEDICATIONS:  His current medications were prescribed over on  the inpatient medical unit and he was discharged on:  1. Risperdal 1 mg p.o. b.i.d.  2. Ativan 1 mg every 4 hours p.r.n. for agitation or withdrawal.  3. NovoLog 70/30 insulin 10 units q. a.m. and 5 units q. p.m.  4. Amaryl 4 mg daily.  5. Lotrel 5/10 mg once daily.  6. Benicar hydrochlorothiazide 20/12.5  mg once daily.  7. Protonix 40 mg daily.   DRUG ALLERGIES:  None.   POSITIVE PHYSICAL FINDINGS:  GENERAL:  Well-nourished, well-developed  male in no distress.  VITAL SIGNS:  When he was initially seen in the emergency room,  he was  found to be afebrile, pulse 113, respirations 24, blood pressure 209/92.  He was stabilized on his medications.  On admission to the unit, he was  noted to be 163 pounds, 5 feet 10 inches, temperature 97, pulse 77,  respirations 16, blood pressure 150/94.  CBGs use have ranged from a  high of 196 mg percent down to 54 mg percent today at 12 noon.   MENTAL STATUS EXAM:  Healthy appearing but withdrawn African-American  male, fully alert, guarded affect, reclusive.  Poor eye contact.  Basically not speaking.  Has been directable today.  Attending meals.  No acting out or bizarre behavior but not very responsive  verbally.  Did  some mumbling when we asked him if he would be willing to talk but his  speech was not coherent.  He appears to be somewhat internally  stimulated.  Thought process:  We are unable to evaluate.  He does  appear guarded and psychotic, possibly internally stimulated.  Mood is  guarded.  Cognition:  He has indicated that he is oriented to person and  basic situation.  Has taken his medicines from the nurses and motor  behavior is appropriate.   DIAGNOSTIC STUDIES:  CBC:  WBC 6.8, hemoglobin 13.1, hematocrit 38.2,  platelets 175,000.  His INR was 1.  Chemistries:  Sodium 143, potassium  3.7, chloride 110, carbon dioxide 27, BUN 16, creatinine 1.12.  Liver  enzymes were all within normal limits.  SGOT 19, SGPT 21, alkaline  phosphatase was 62 and total bilirubin 0.7.  His total albumin was  somewhat decreased at 3.2, calcium normal at 9.4.  TSH 1.038.  Ammonia  level 16.  Acetaminophen and salicylate were unremarkable.  Urine drug  screen positive for benzodiazepines and marijuana.  His routine  urinalysis did reveal 100 mg/dL of protein, no leukocytes or remarkable  cells.   AXIS I:  Psychosis not otherwise specified, rule out bipolar disorder  with psychotic features.  Distant history of substance abuse, current  marijuana use.  AXIS II:  Deferred.  AXIS III:  Diabetes mellitus 2, controlled.  Hypertension.  AXIS IV:  Deferred.  AXIS V:  Current 30. past year 76.   PLAN:  Voluntarily admit the patient with every 15-minute checks in  place.  To control his psychosis, we have started back on his Risperdal  1 mg M-Tabs q. a.m. and nightly and 1 mg every 6 hours p.r.n. for any  agitation, and have made available Cogentin 2 mg every 8 hours p.r.n.  either IM or p.o. for any EPS or echophrasia, but he has not had to use  any Cogentin at this time.  Ambien 5 mg nightly p.r.n. for insomnia and that may be repeated once, and Ativan 2 mg IM or p.o. every 6 hours  p.r.n. for  agitation.  We will not use any IM meds unless he gets  severely agitated and refuses orals.  We have had a phone conference  with Dr. Concepcion Flores, his primary care physician, about his falling CBGs and  we will discontinue his 70/30 insulin at this time and continue him only  on oral agents at the instruction of Dr. Concepcion Flores.   Estimated length of stay is 7 days.  We are going to try to get a family  conference with his wife as he makes progress.      Margaret A. Lorin Picket, N.P.      [name], M.D.  Electronically Signed    MAS/MEDQ  D:  03/07/2007  T:  03/08/2007  Job:  [id number]

## 2012-01-28 IMAGING — CT CT HEAD W/O CM
1 series · 16 of 30 positions shown, 20 images · non-contrast
Comparison: 01/28/2009

CLINICAL DATA: Near-syncope

CT HEAD WITHOUT CONTRAST
TECHNIQUE: Contiguous axial images were obtained from the base of
the skull through the vertex without contrast.

[Series 2: head routine 4.8 h37s · axial · 0.45mm/px · z∈[-116,+17]mm · 16 of 30 slices shown, 20 images]
[im 2/30  brain]
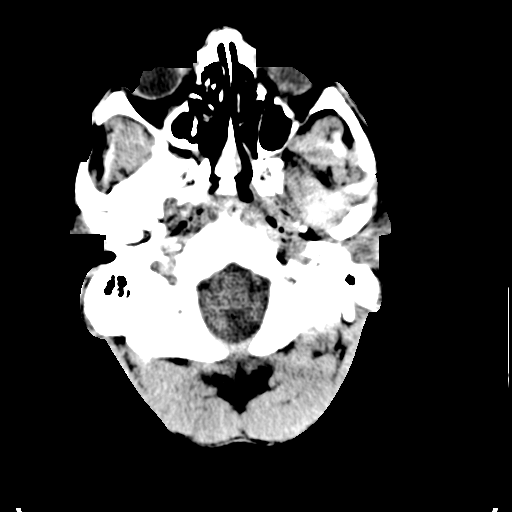
[im 2/30  bone]
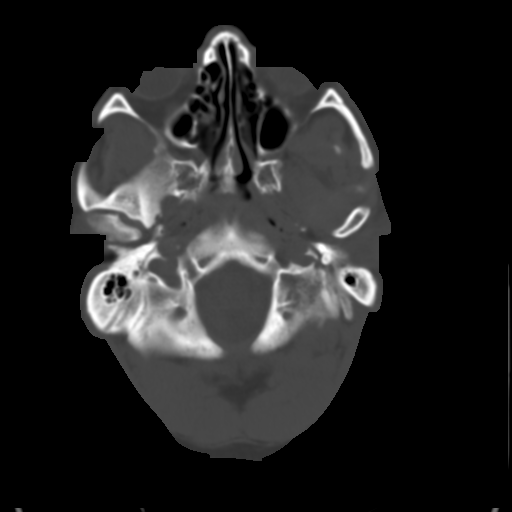
[im 4/30  brain]
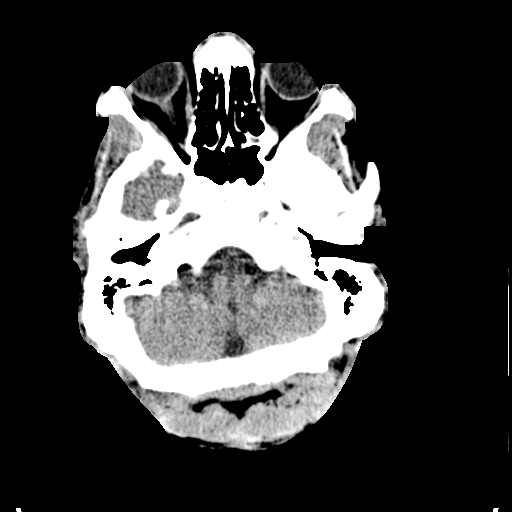
[im 6/30  brain]
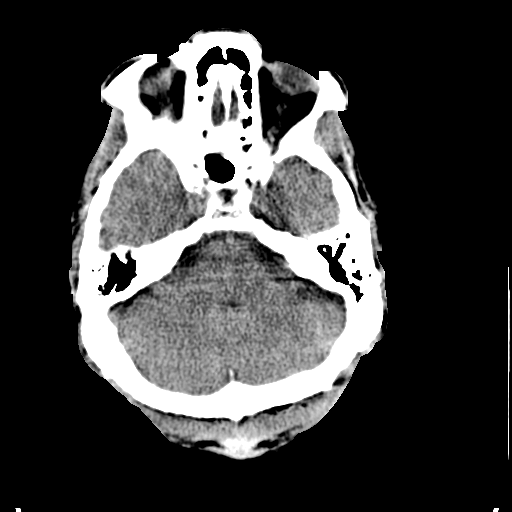
[im 8/30  brain]
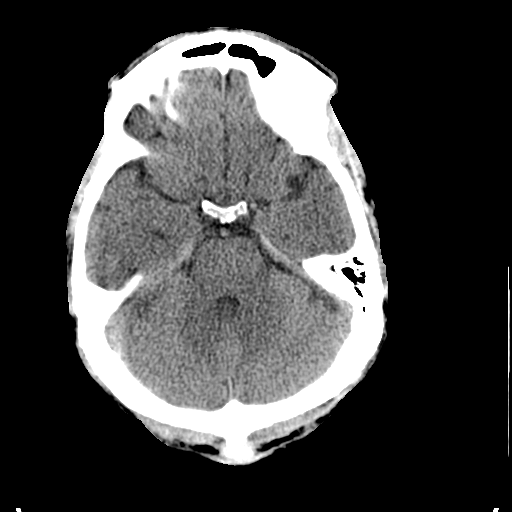
[im 9/30  brain]
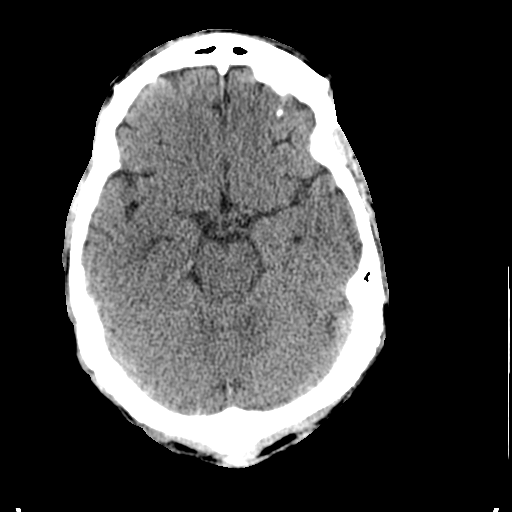
[im 9/30  bone]
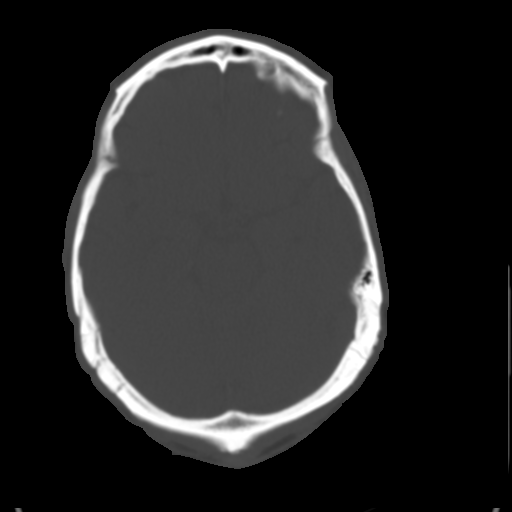
[im 11/30  brain]
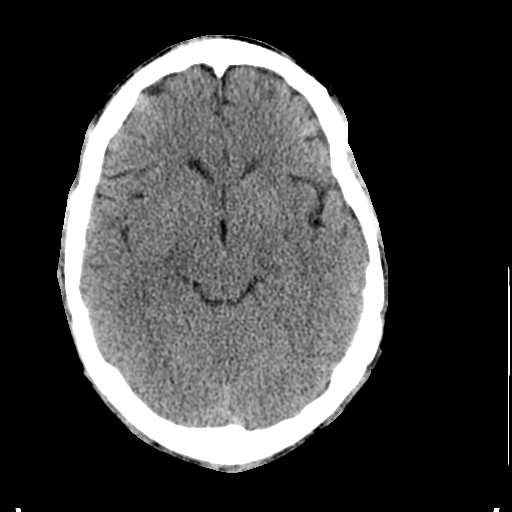
[im 13/30  brain]
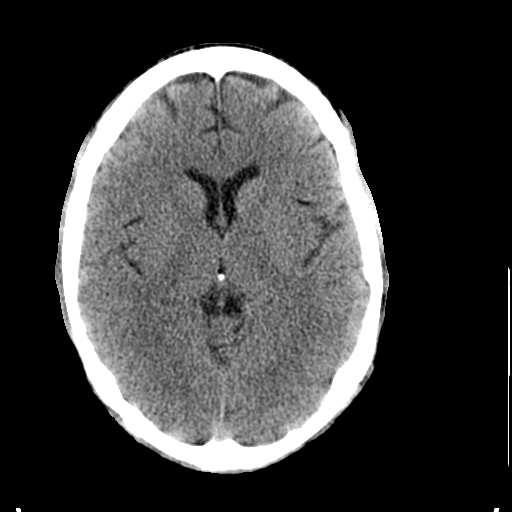
[im 15/30  brain]
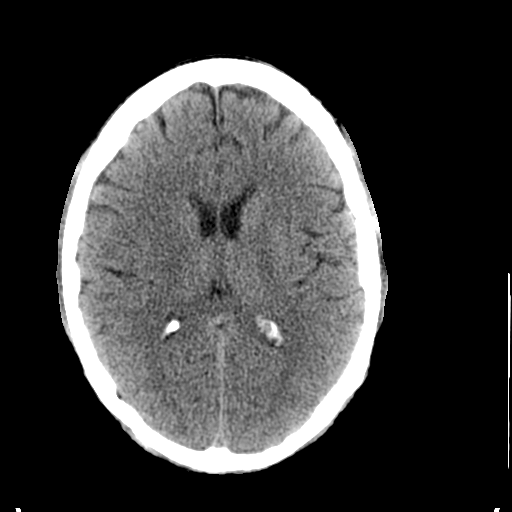
[im 16/30  brain]
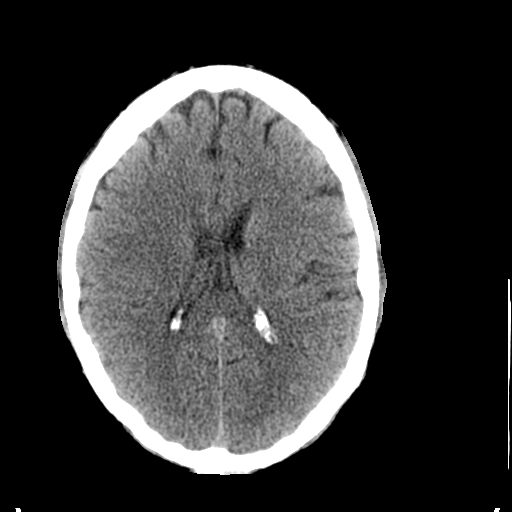
[im 16/30  bone]
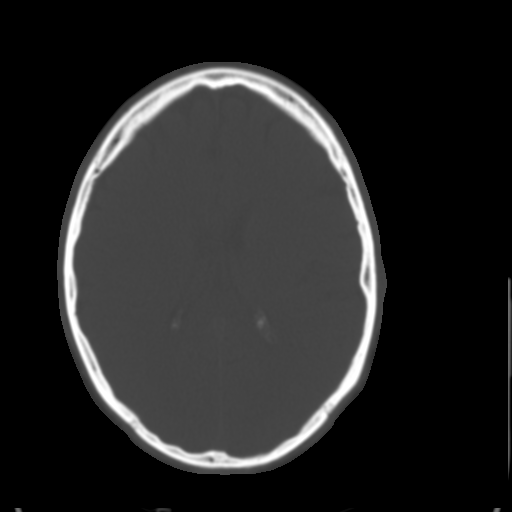
[im 18/30  brain]
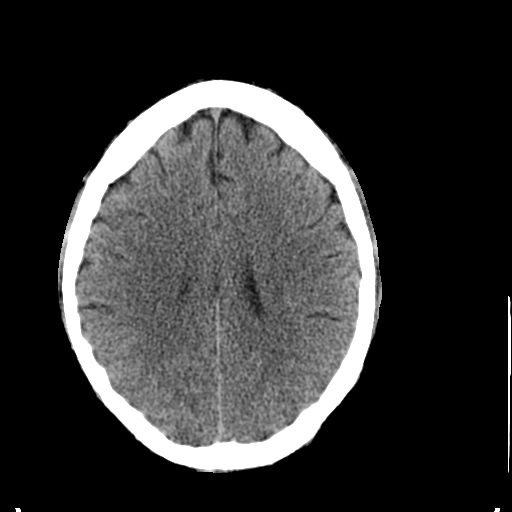
[im 20/30  brain]
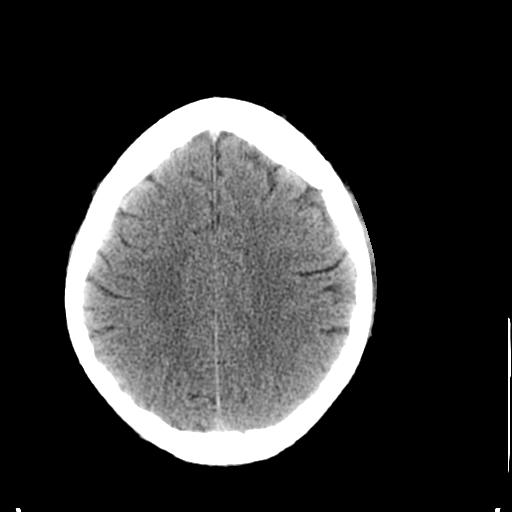
[im 22/30  brain]
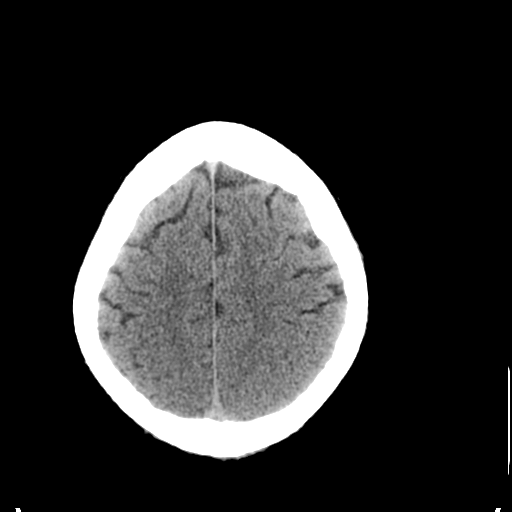
[im 23/30  brain]
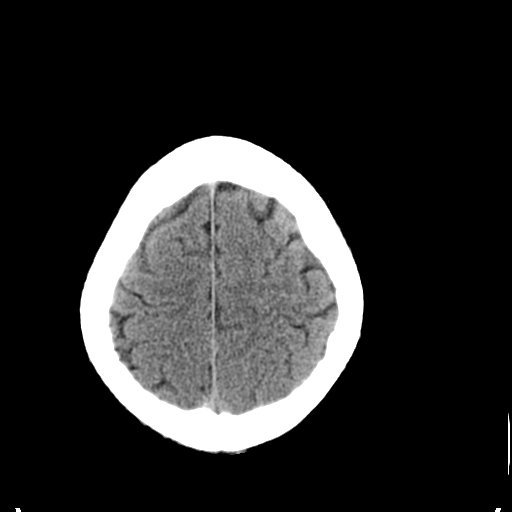
[im 23/30  bone]
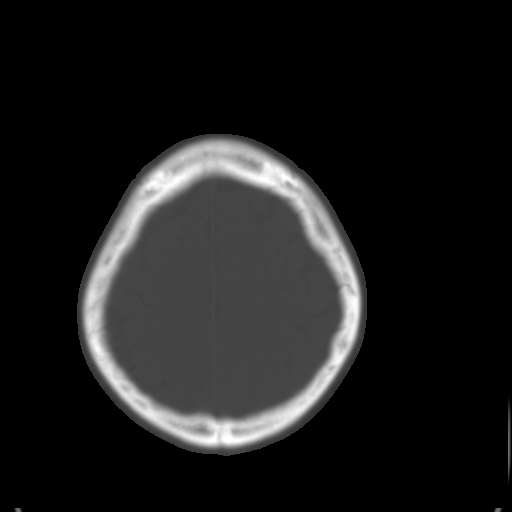
[im 25/30  brain]
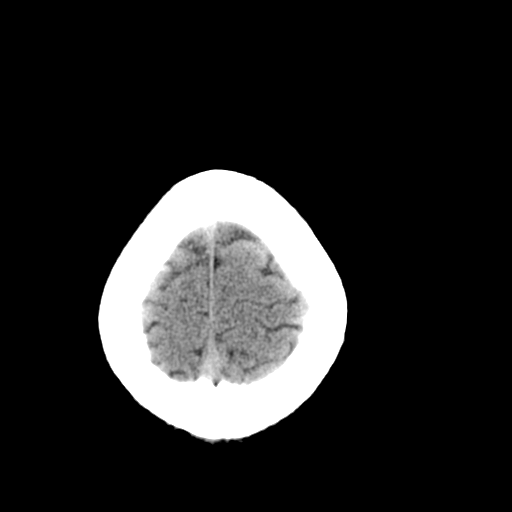
[im 27/30  brain]
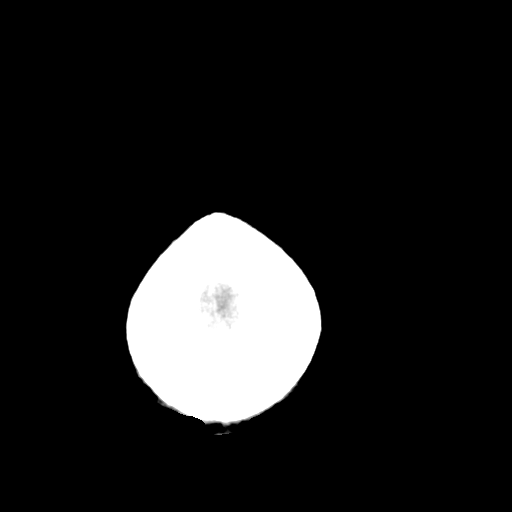
[im 29/30  brain]
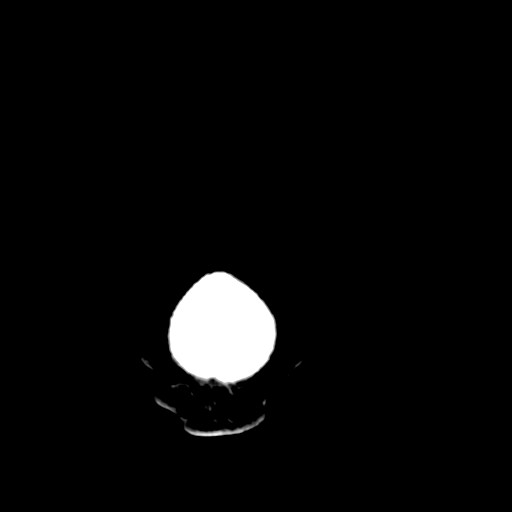

[16 of 30 positions shown; findings below may reference images not displayed]

FINDINGS: Ventricular size and CSF spaces normal.

 No evidence for acute infarct, hemorrhage, or mass lesion. No
extra-axial fluid collections or midline shift.  Calvarium intact.
No fluid in the sinuses visualized.
IMPRESSION: No acute or significant findings.

## 2012-01-28 IMAGING — CR DG CHEST 2V
2 series · 2 of 2 positions shown · non-contrast
Comparison: CT chest and chest x-ray 01/28/2009

CLINICAL DATA: Near syncope, weakness.

CHEST - 2 VIEW

[w chest pa]
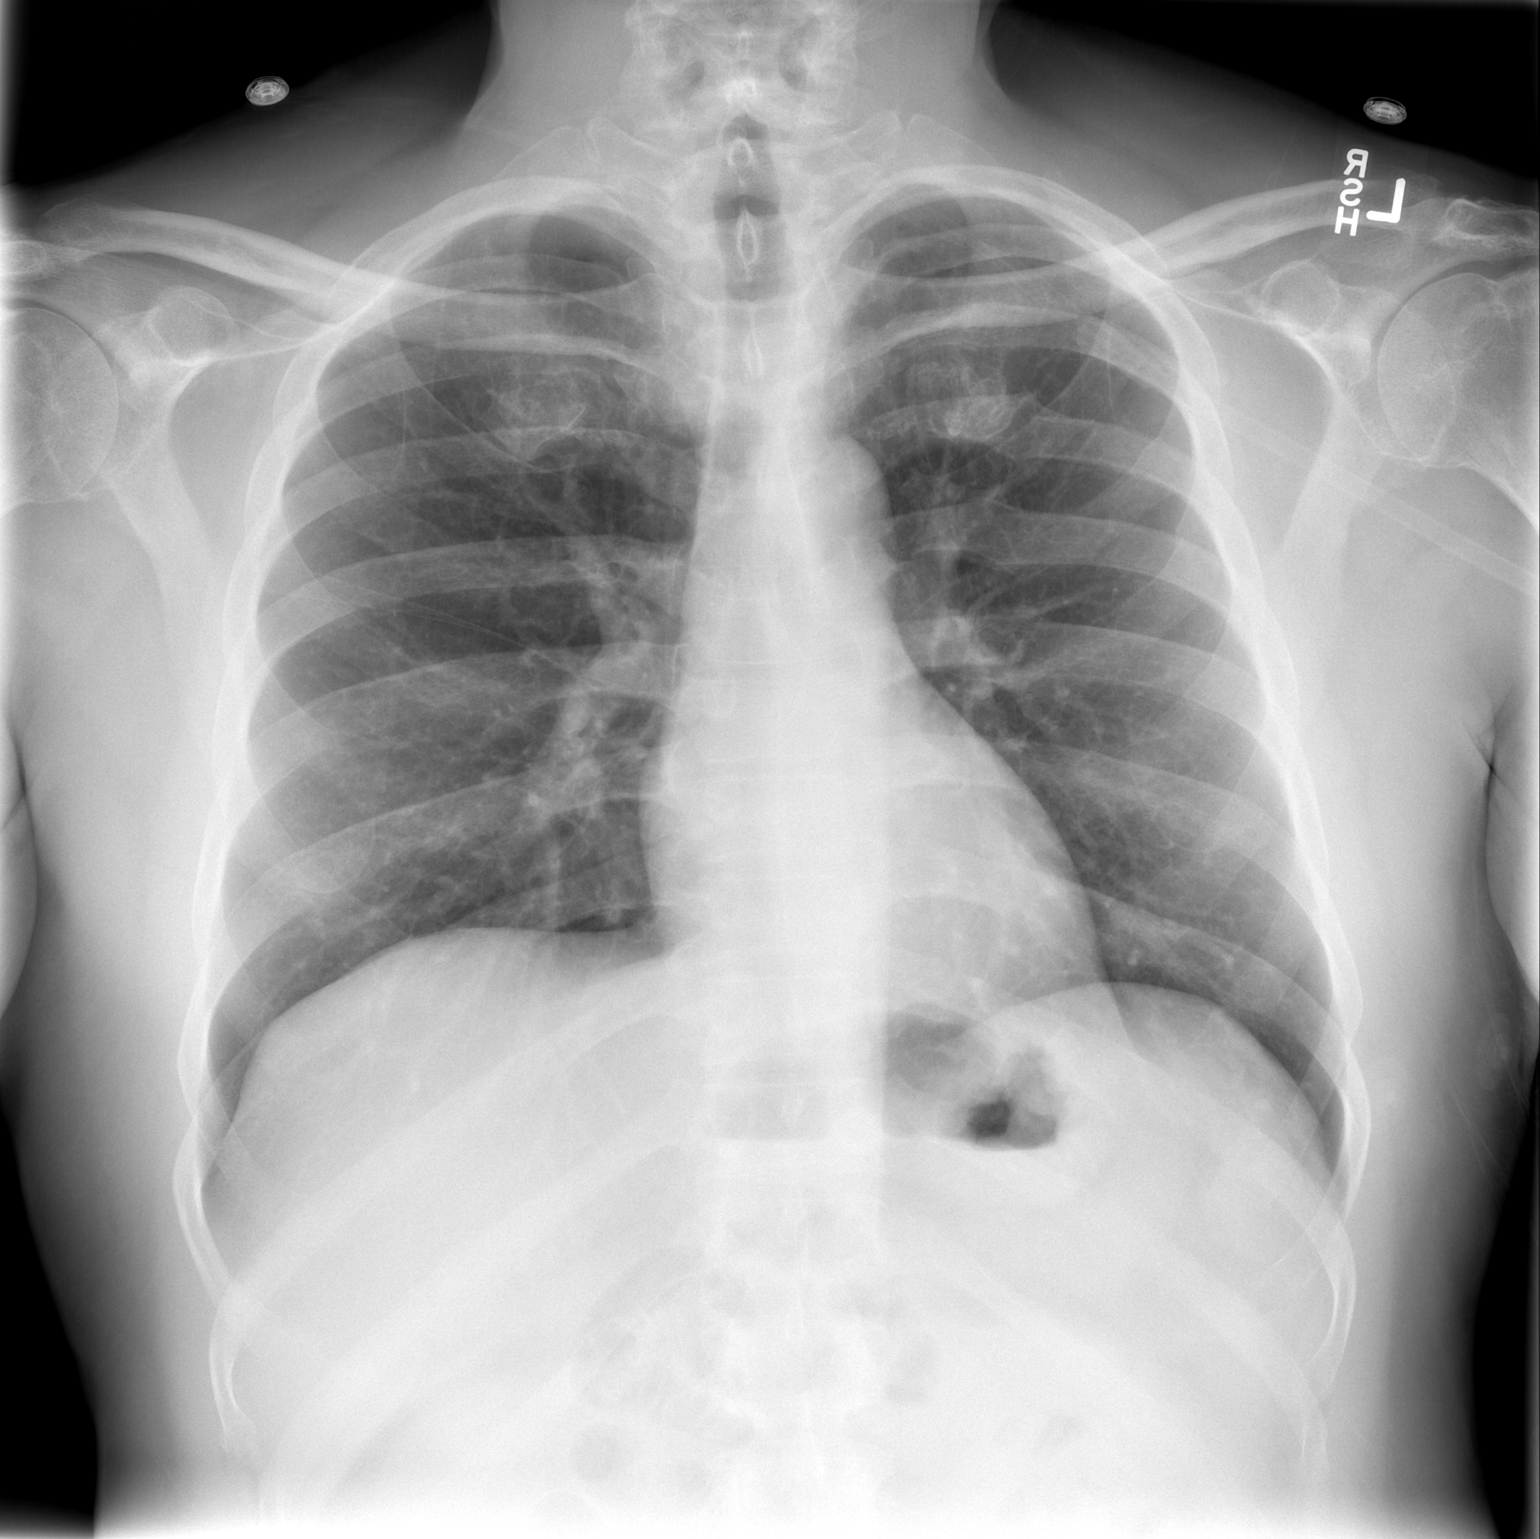

[w chest lat]
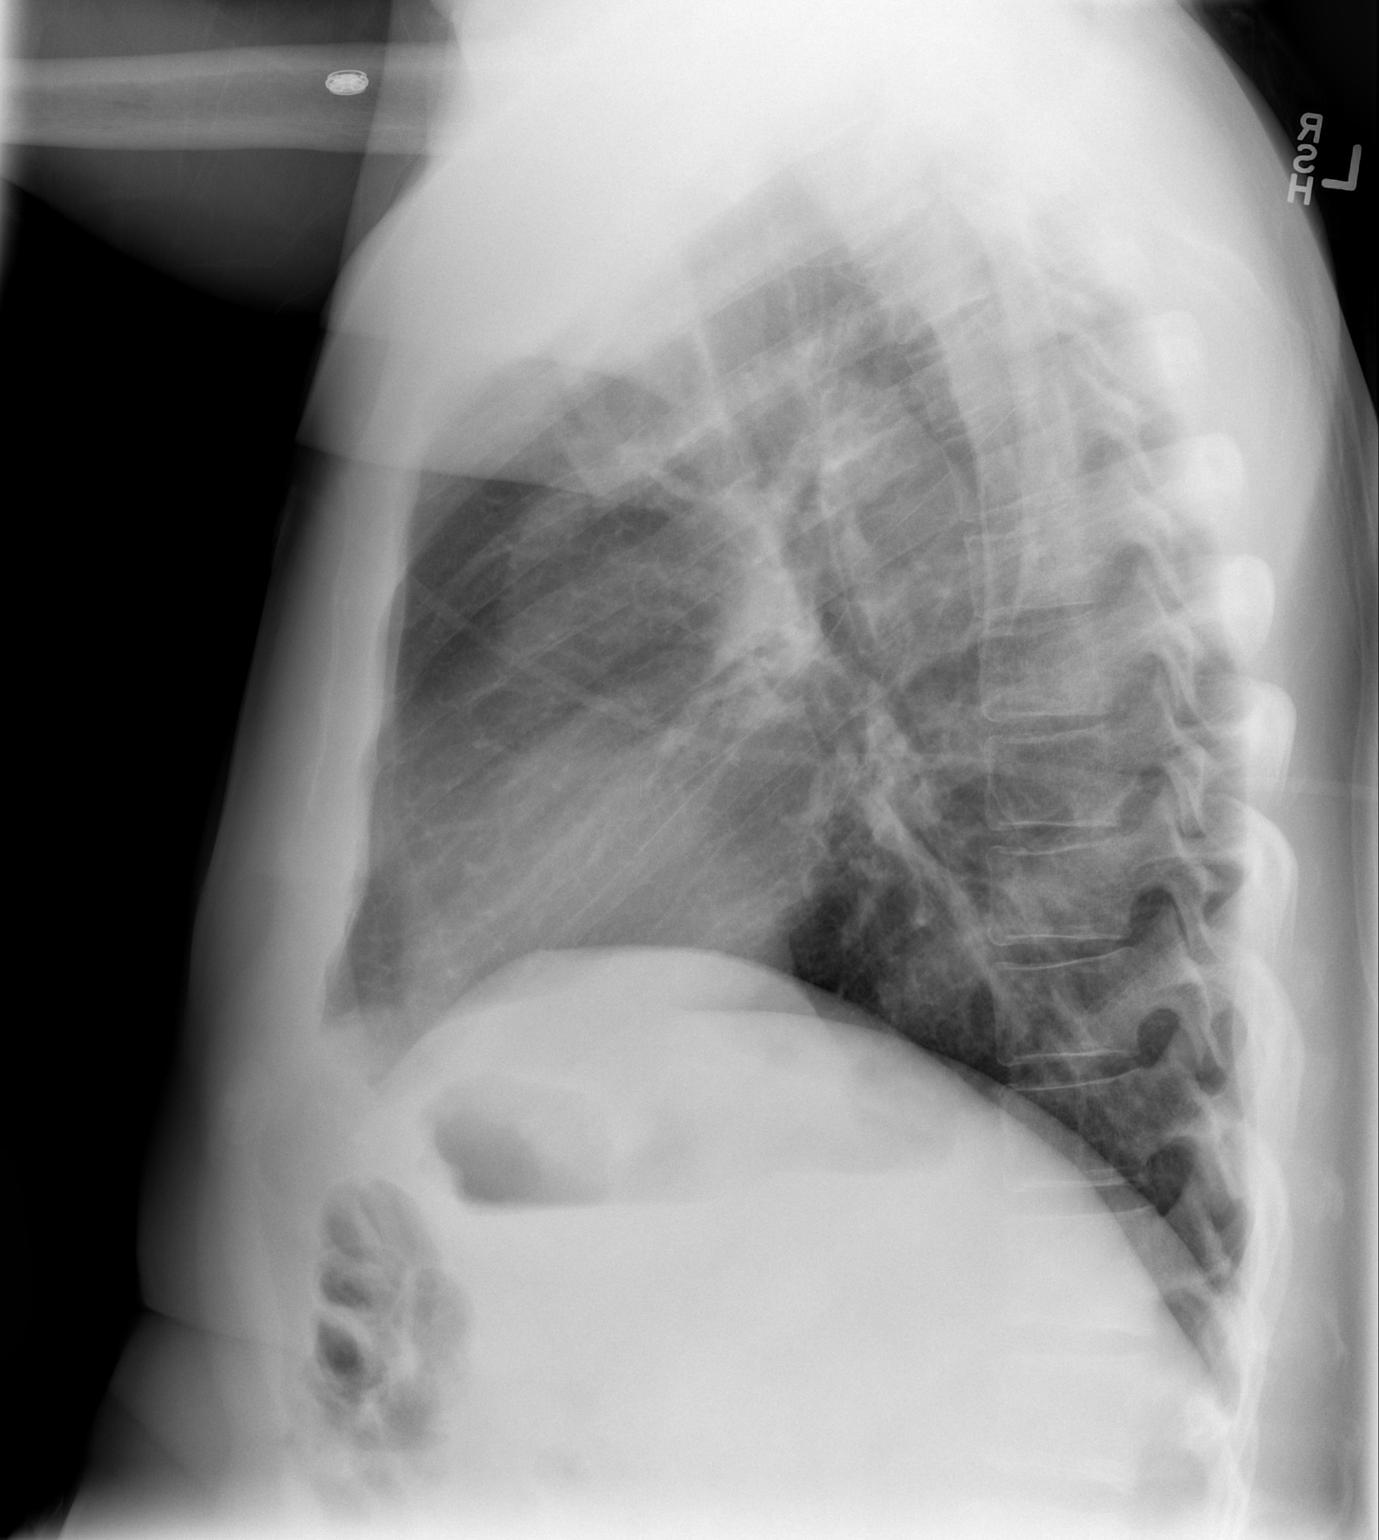

[2 of 2 positions shown; findings below may reference images not displayed]

FINDINGS: Trachea is midline.  Heart size normal.  Lungs are clear.
No pleural fluid.
IMPRESSION: No acute findings.

## 2016-12-08 ENCOUNTER — Other Ambulatory Visit: Payer: Self-pay | Admitting: Nephrology

## 2016-12-08 DIAGNOSIS — N183 Chronic kidney disease, stage 3 unspecified: Secondary | ICD-10-CM

## 2016-12-14 ENCOUNTER — Other Ambulatory Visit: Payer: Self-pay

## 2017-01-01 ENCOUNTER — Ambulatory Visit
Admission: RE | Admit: 2017-01-01 | Discharge: 2017-01-01 | Disposition: A | Discharge status: Home / Self Care | Payer: Medicaid Other | Source: Ambulatory Visit | Attending: Nephrology | Admitting: Nephrology

## 2017-01-01 DIAGNOSIS — N183 Chronic kidney disease, stage 3 unspecified: Secondary | ICD-10-CM

## 2018-02-11 IMAGING — US US RENAL
1 series · 14 of 25 positions shown · non-contrast
Comparison: None.

CLINICAL DATA: New disease

EXAM:
RENAL / URINARY TRACT ULTRASOUND COMPLETE

[Series 1: us renal · 0.20mm/px · 14 of 41 slices shown]
[im 1/41]
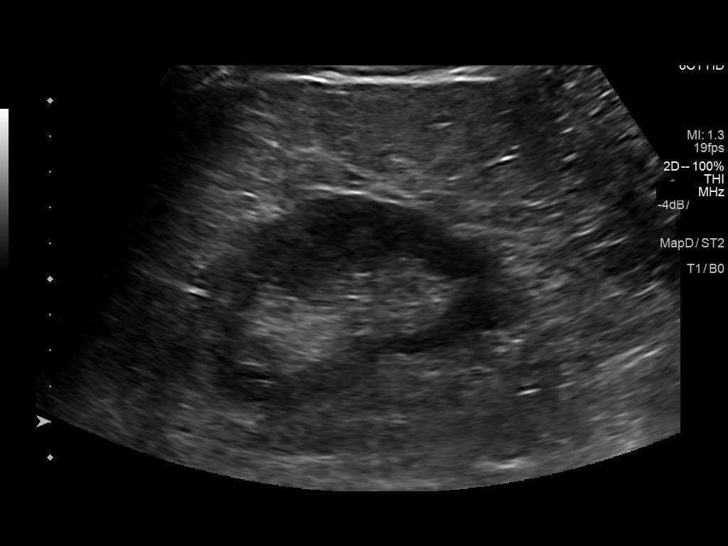
[im 4/41]
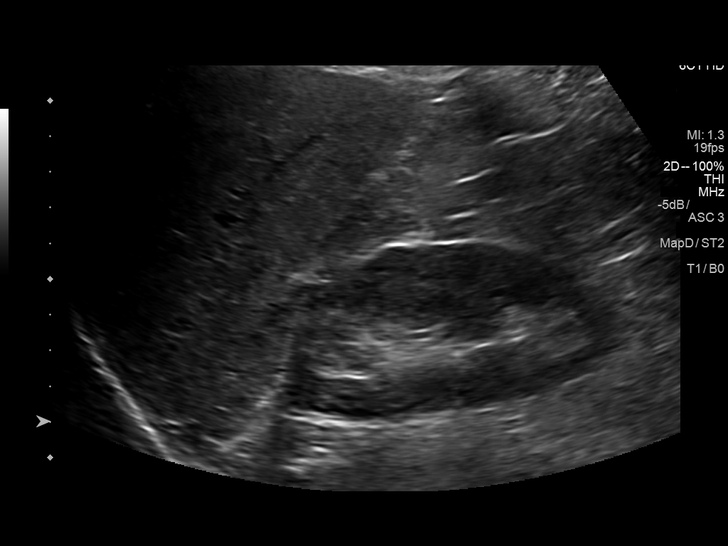
[im 7/41]
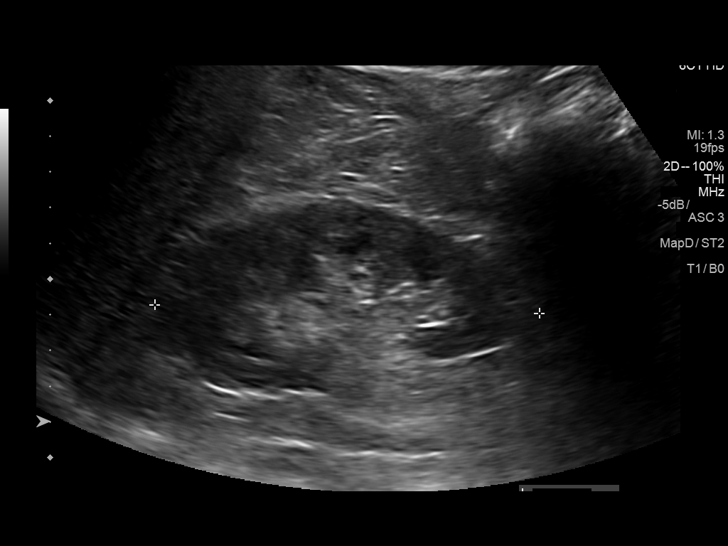
[im 11/41]
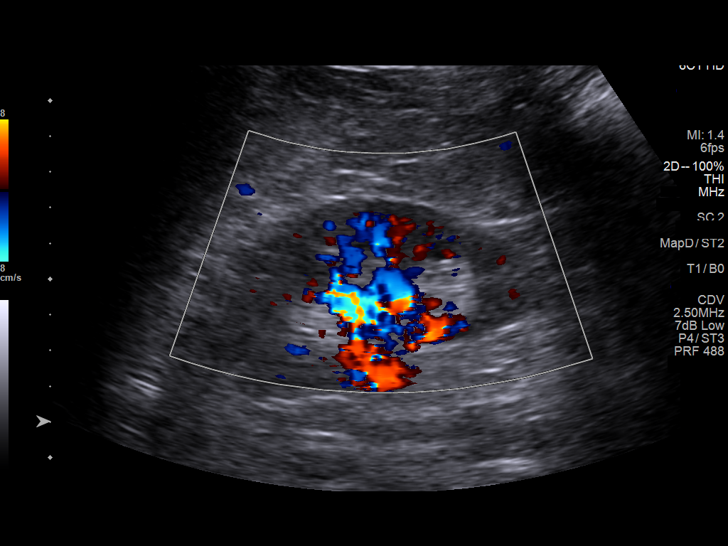
[im 14/41]
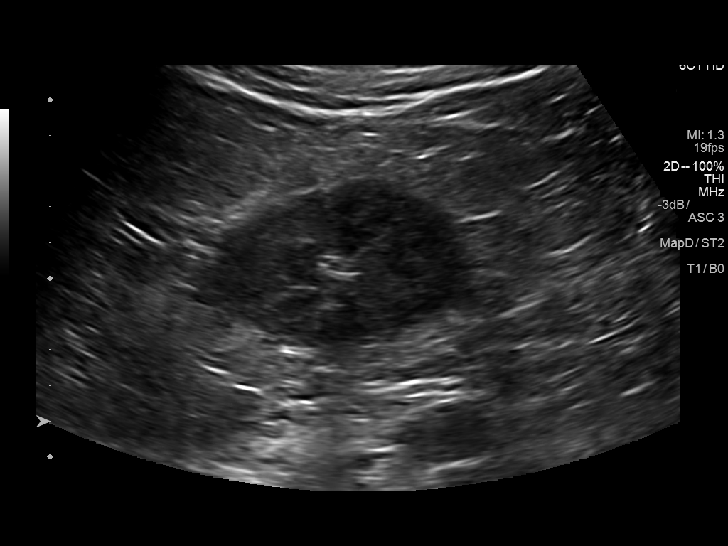
[im 16/41]
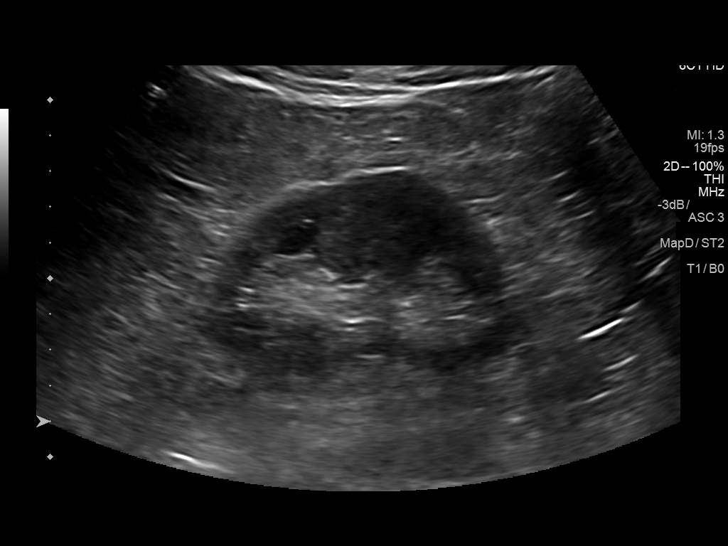
[im 19/41]
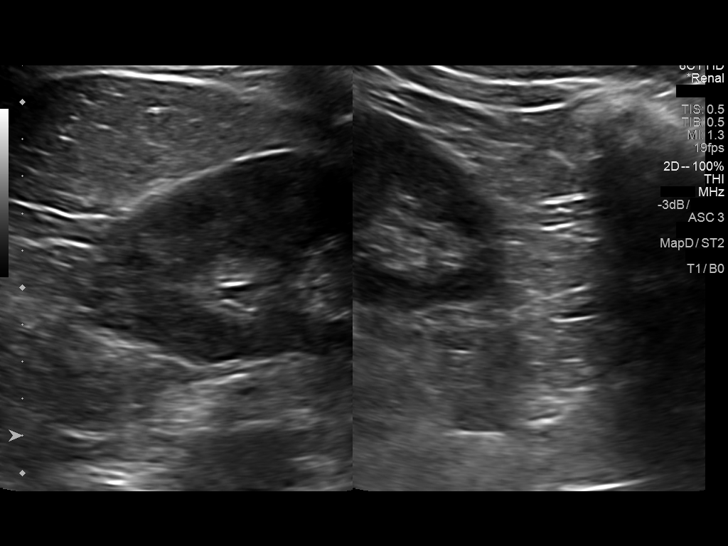
[im 22/41]
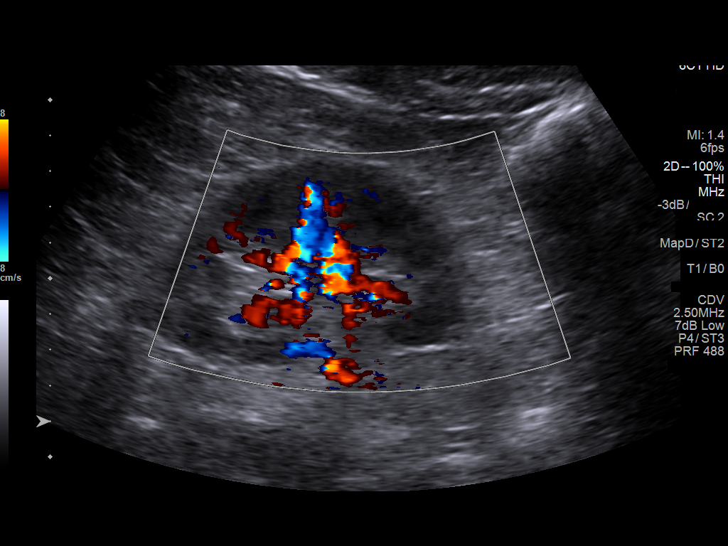
[im 26/41]
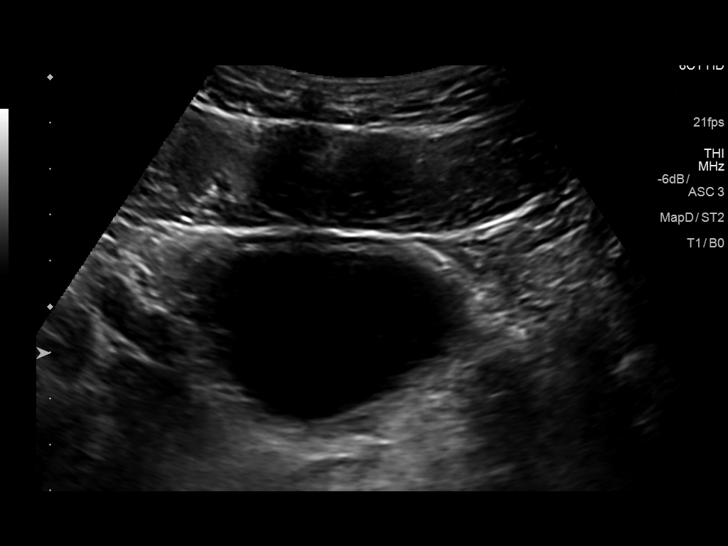
[im 27/41]
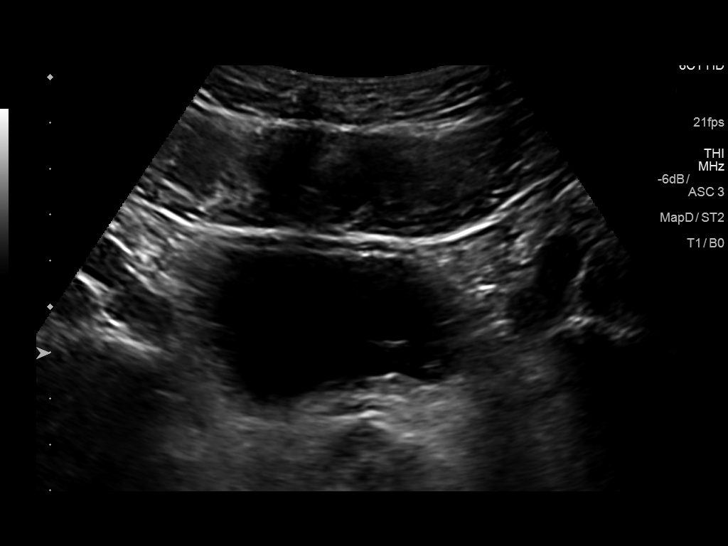
[im 31/41]
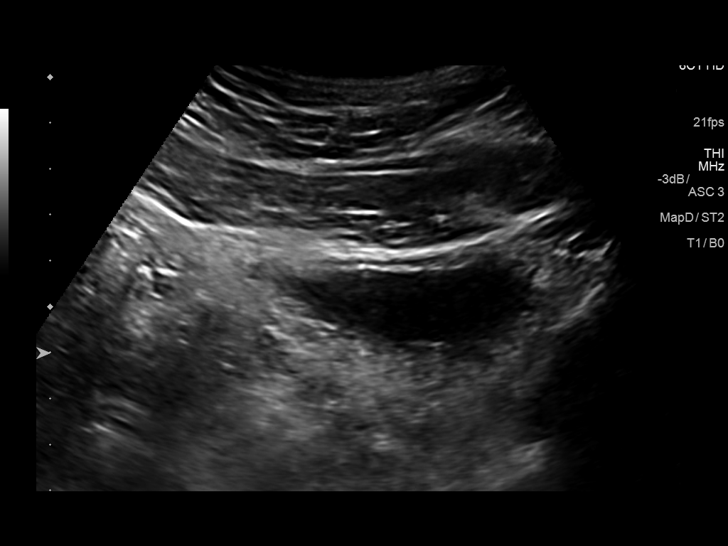
[im 34/41]
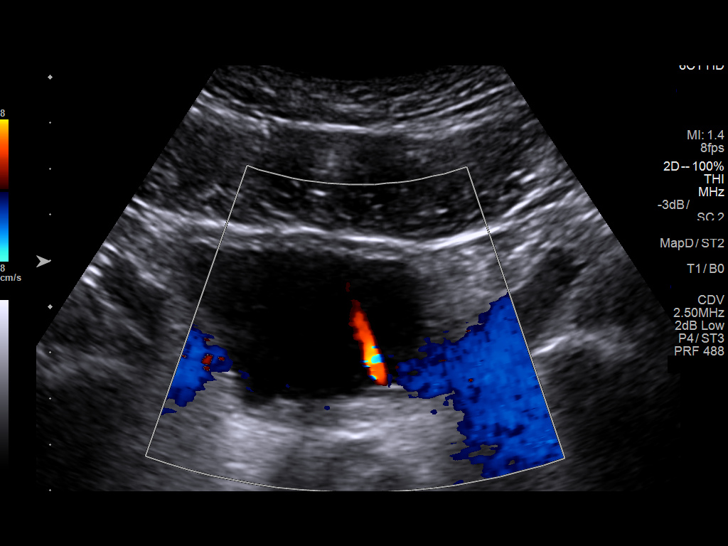
[im 37/41]
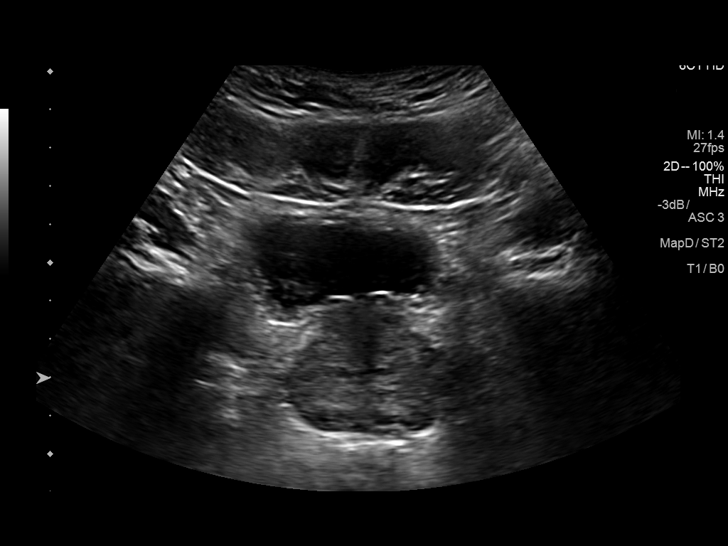
[im 41/41]
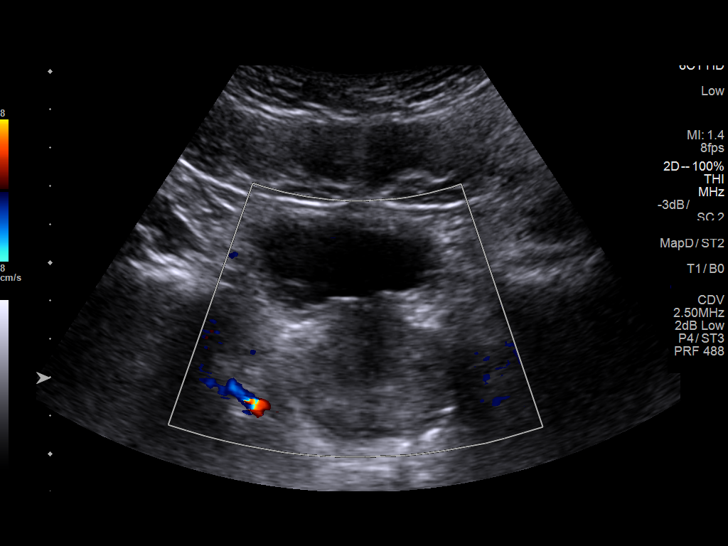

[14 of 25 positions shown; findings below may reference images not displayed]

FINDINGS: Right Kidney:

Length: 10.8 cm.. Echogenicity within normal limits. No mass or
hydronephrosis visualized.

Left Kidney:

Length: 9.8 cm.. Echogenicity within normal limits. No mass or
hydronephrosis visualized.

Bladder:

Appears normal for degree of bladder distention.
IMPRESSION: No acute abnormality noted.

## 2020-02-29 ENCOUNTER — Ambulatory Visit: Payer: Medicaid Other | Attending: Internal Medicine

## 2020-02-29 DIAGNOSIS — Z23 Encounter for immunization: Secondary | ICD-10-CM

## 2020-02-29 NOTE — Progress Notes (Signed)
   Covid-19 Vaccination Clinic  Name:  TAG WURTZ    MRN: [id number] DOB: [birth year]  02/29/2020  Mr. Hellmer was observed post Covid-19 immunization for 15 minutes without incident. He was provided with Vaccine Information Sheet and instruction to access the V-Safe system.   Mr. Sarvis was instructed to call 911 with any severe reactions post vaccine: Marland Kitchen Difficulty breathing  . Swelling of face and throat  . A fast heartbeat  . A bad rash all over body  . Dizziness and weakness   Immunizations Administered    Name Date Dose VIS Date Route   Pfizer COVID-19 Vaccine 02/29/2020  2:23 PM 0.3 mL 11/03/2019 Intramuscular   Manufacturer: [employer], [employer]   Lot: [id number]   NDC: [address]-9

## 2020-03-25 ENCOUNTER — Ambulatory Visit: Payer: Medicaid Other | Attending: Internal Medicine

## 2020-03-25 DIAGNOSIS — Z23 Encounter for immunization: Secondary | ICD-10-CM

## 2020-03-25 NOTE — Progress Notes (Signed)
   Covid-19 Vaccination Clinic  Name:  [name]    MRN: [id number] DOB: 1956-07-07  03/25/2020  [name] was observed post Covid-19 immunization for 15 minutes without incident. He was provided with Vaccine Information Sheet and instruction to access the V-Safe system.   [name] was instructed to call 911 with any severe reactions post vaccine: Marland Kitchen Difficulty breathing  . Swelling of face and throat  . A fast heartbeat  . A bad rash all over body  . Dizziness and weakness   Immunizations Administered    Name Date Dose VIS Date Route   Pfizer COVID-19 Vaccine 03/25/2020  9:09 AM 0.3 mL 01/17/2019 Intramuscular   Manufacturer: [employer], [employer]   Lot: [id number]   NDC: [address]-0

## 2022-04-14 LAB — LAB REPORT - SCANNED: EGFR: 31

## 2022-08-04 ENCOUNTER — Encounter (HOSPITAL_COMMUNITY): Payer: Self-pay

## 2022-08-04 ENCOUNTER — Emergency Department (HOSPITAL_COMMUNITY)
Admission: EM | Admit: 2022-08-04 | Discharge: 2022-08-04 | Disposition: A | Discharge status: Home / Self Care | Payer: Medicare Other | Attending: Emergency Medicine | Admitting: Emergency Medicine

## 2022-08-04 ENCOUNTER — Emergency Department (HOSPITAL_COMMUNITY): Payer: Medicare Other

## 2022-08-04 DIAGNOSIS — Z7984 Long term (current) use of oral hypoglycemic drugs: Secondary | ICD-10-CM | POA: Diagnosis not present

## 2022-08-04 DIAGNOSIS — N189 Chronic kidney disease, unspecified: Secondary | ICD-10-CM | POA: Diagnosis not present

## 2022-08-04 DIAGNOSIS — Z79899 Other long term (current) drug therapy: Secondary | ICD-10-CM | POA: Insufficient documentation

## 2022-08-04 DIAGNOSIS — I129 Hypertensive chronic kidney disease with stage 1 through stage 4 chronic kidney disease, or unspecified chronic kidney disease: Secondary | ICD-10-CM | POA: Insufficient documentation

## 2022-08-04 DIAGNOSIS — I10 Essential (primary) hypertension: Secondary | ICD-10-CM

## 2022-08-04 DIAGNOSIS — E119 Type 2 diabetes mellitus without complications: Secondary | ICD-10-CM | POA: Diagnosis not present

## 2022-08-04 DIAGNOSIS — I4892 Unspecified atrial flutter: Secondary | ICD-10-CM | POA: Diagnosis not present

## 2022-08-04 DIAGNOSIS — Z7901 Long term (current) use of anticoagulants: Secondary | ICD-10-CM | POA: Insufficient documentation

## 2022-08-04 DIAGNOSIS — R030 Elevated blood-pressure reading, without diagnosis of hypertension: Secondary | ICD-10-CM | POA: Diagnosis present

## 2022-08-04 LAB — CBC WITH DIFFERENTIAL/PLATELET
Abs Immature Granulocytes: 0.03 10*3/uL (ref 0.00–0.07)
Basophils Absolute: 0 10*3/uL (ref 0.0–0.1)
Basophils Relative: 0 %
Eosinophils Absolute: 0 10*3/uL (ref 0.0–0.5)
Eosinophils Relative: 0 %
HCT: 51.1 % (ref 39.0–52.0)
Hemoglobin: 17.4 g/dL — ABNORMAL HIGH (ref 13.0–17.0)
Immature Granulocytes: 0 %
Lymphocytes Relative: 24 %
Lymphs Abs: 1.6 10*3/uL (ref 0.7–4.0)
MCH: 31.9 pg (ref 26.0–34.0)
MCHC: 34.1 g/dL (ref 30.0–36.0)
MCV: 93.6 fL (ref 80.0–100.0)
Monocytes Absolute: 0.6 10*3/uL (ref 0.1–1.0)
Monocytes Relative: 9 %
Neutro Abs: 4.4 10*3/uL (ref 1.7–7.7)
Neutrophils Relative %: 67 %
Platelets: 154 10*3/uL (ref 150–400)
RBC: 5.46 MIL/uL (ref 4.22–5.81)
RDW: 13.6 % (ref 11.5–15.5)
WBC: 6.7 10*3/uL (ref 4.0–10.5)
nRBC: 0 % (ref 0.0–0.2)

## 2022-08-04 LAB — URINALYSIS, ROUTINE W REFLEX MICROSCOPIC
Bacteria, UA: NONE SEEN
Bilirubin Urine: NEGATIVE
Glucose, UA: >=500 mg/dL — AB
Hgb urine dipstick: NEGATIVE
Ketones, ur: 5 mg/dL — AB
Leukocytes,Ua: NEGATIVE
Nitrite: NEGATIVE
Protein, ur: >=300 mg/dL — AB
Specific Gravity, Urine: 1.014 (ref 1.005–1.030)
pH: 5 (ref 5.0–8.0)

## 2022-08-04 LAB — BASIC METABOLIC PANEL
Anion gap: 9 (ref 5–15)
BUN: 18 mg/dL (ref 8–23)
CO2: 21 mmol/L — ABNORMAL LOW (ref 22–32)
Calcium: 8.9 mg/dL (ref 8.9–10.3)
Chloride: 110 mmol/L (ref 98–111)
Creatinine, Ser: 2.36 mg/dL — ABNORMAL HIGH (ref 0.61–1.24)
GFR, Estimated: 30 mL/min — ABNORMAL LOW (ref >60)
Glucose, Bld: 69 mg/dL — ABNORMAL LOW (ref 70–99)
Potassium: 3.7 mmol/L (ref 3.5–5.1)
Sodium: 140 mmol/L (ref 135–145)

## 2022-08-04 MED ORDER — DILTIAZEM HCL ER COATED BEADS 120 MG PO CP24
120.0000 mg | ORAL_CAPSULE | Freq: Once | ORAL | Status: AC
  Administered 2022-08-04: 120 mg via ORAL
  Filled 2022-08-04: qty 1

## 2022-08-04 MED ORDER — APIXABAN 5 MG PO TABS: 5.0000 mg | ORAL_TABLET | Freq: Two times a day (BID) | ORAL | 2 refills | Status: DC

## 2022-08-04 MED ORDER — APIXABAN 5 MG PO TABS
5.0000 mg | ORAL_TABLET | Freq: Once | ORAL | Status: AC
  Administered 2022-08-04: 5 mg via ORAL
  Filled 2022-08-04: qty 1

## 2022-08-04 MED ORDER — DILTIAZEM HCL ER COATED BEADS 120 MG PO CP24: 120.0000 mg | ORAL_CAPSULE | Freq: Every day | ORAL | 2 refills | Status: DC

## 2022-08-04 NOTE — ED Provider Notes (Signed)
[facility]-EMERGENCY DEPT Provider Note   CSN: [id number] Arrival date & time: 08/04/22  1251     History {Add pertinent medical, surgical, social history, OB history to HPI:1} Chief Complaint  Patient presents with   Hypertension    [name] is a 66 y.o. male.   Hypertension Pertinent negatives include no chest pain, no abdominal pain, no headaches and no shortness of breath.  Patient presents for concern of elevated blood pressure.  Medical history includes HTN, HLD, DM.  He has never been told that he has heart failure.  He is prescribed 40 mg of Lasix daily.  Patient denies any current or recent symptoms.  He was previously prescribed 10 mg of benazepril.  1 week ago, due to consistent elevated blood pressures, he was advised to increase his dose to 20 mg daily.  He has been taking this as prescribed.  He is not on any other blood pressure medications.  Patient continues to check his blood pressure at home and it has been elevated in the range of 160s over 100s.  Earlier today, it was elevated as high as 200s over 100.  For this reason, patient presents to the ED.     Home Medications Prior to Admission medications   Not on File      Allergies    Patient has no known allergies.    Review of Systems   Review of Systems  Respiratory:  Negative for chest tightness and shortness of breath.   Cardiovascular:  Negative for chest pain.  Gastrointestinal:  Negative for abdominal pain and nausea.  Neurological:  Negative for dizziness, weakness, light-headedness, numbness and headaches.  All other systems reviewed and are negative.   Physical Exam Updated Vital Signs BP (!) 172/116   Pulse 81   Temp 98.3 F (36.8 C) (Oral)   Resp 18   SpO2 98%  Physical Exam Vitals and nursing note reviewed.  Constitutional:      General: He is not in acute distress.    Appearance: Normal appearance. He is well-developed. He is not ill-appearing,  toxic-appearing or diaphoretic.  HENT:     Head: Normocephalic and atraumatic.     Right Ear: External ear normal.     Left Ear: External ear normal.     Mouth/Throat:     Mouth: Mucous membranes are moist.     Pharynx: Oropharynx is clear.  Eyes:     Extraocular Movements: Extraocular movements intact.     Conjunctiva/sclera: Conjunctivae normal.  Cardiovascular:     Rate and Rhythm: Normal rate. Rhythm irregular.     Heart sounds: No murmur heard. Pulmonary:     Effort: Pulmonary effort is normal. No respiratory distress.     Breath sounds: No wheezing, rhonchi or rales.  Chest:     Chest wall: No tenderness.  Abdominal:     General: There is no distension.     Palpations: Abdomen is soft.     Tenderness: There is no abdominal tenderness.  Musculoskeletal:        General: No swelling. Normal range of motion.     Cervical back: Normal range of motion and neck supple.  Skin:    General: Skin is warm and dry.     Coloration: Skin is not jaundiced or pale.  Neurological:     General: No focal deficit present.     Mental Status: He is alert and oriented to person, place, and time.     Cranial Nerves:  No cranial nerve deficit.     Sensory: No sensory deficit.     Motor: No weakness.     Coordination: Coordination normal.  Psychiatric:        Mood and Affect: Mood normal.        Behavior: Behavior normal.     ED Results / Procedures / Treatments   Labs (all labs ordered are listed, but only abnormal results are displayed) Labs Reviewed  BASIC METABOLIC PANEL - Abnormal; Notable for the following components:      Result Value   CO2 21 (*)    Glucose, Bld 69 (*)    Creatinine, Ser 2.36 (*)    GFR, Estimated 30 (*)    All other components within normal limits  CBC WITH DIFFERENTIAL/PLATELET - Abnormal; Notable for the following components:   Hemoglobin 17.4 (*)    All other components within normal limits  URINALYSIS, ROUTINE W REFLEX MICROSCOPIC     EKG None  Radiology No results found.  Procedures Procedures  {Document cardiac monitor, telemetry assessment procedure when appropriate:1}  Medications Ordered in ED Medications - No data to display  ED Course/ Medical Decision Making/ A&P                           Medical Decision Making Amount and/or Complexity of Data Reviewed Radiology: ordered.  Risk Prescription drug management.   This patient presents to the ED for concern of hypertension, this involves an extensive number of treatment options, and is a complaint that carries with it a high risk of complications and morbidity.  The differential diagnosis includes hypertensive urgency, hypertensive emergency, inadequately treated essential hypertension   Co morbidities that complicate the patient evaluation  HTN, DM, HLD, CKD   Additional history obtained:  Additional history obtained from patient's wife External records from outside source obtained and reviewed including EMR   Lab Tests:  I Ordered, and personally interpreted labs.  The pertinent results include: Creatinine today is 2.36.  This is increased when compared to lab work from 12 years ago.  Patient does see a primary care doctor and does have routine lab work.  He is also initiated care with nephrologist, Dr. Moshe Cipro.  He is unaware of what his baseline creatinine is, however, given that it was 1.7 112 years ago and he has since established care with nephrology for chronic kidney disease, I suspect that this is close to his baseline.  CBC shows erythrocytosis without leukocytosis.  Electrolytes are normal.   Imaging Studies ordered:  I ordered imaging studies including chest x-ray I independently visualized and interpreted imaging which showed *** I agree with the radiologist interpretation   Cardiac Monitoring: / EKG:  The patient was maintained on a cardiac monitor.  I personally viewed and interpreted the cardiac monitored which  showed an underlying rhythm of: Atrial flutter   Consultations Obtained:  I requested consultation with the ***,  and discussed lab and imaging findings as well as pertinent plan - they recommend: ***   Problem List / ED Course / Critical interventions / Medication management  Patient is a 66 year old male presenting for asymptomatic hypertension.  This has been ongoing over the past 2 weeks.  Blood pressure has remained elevated at home in the range of 160s SBP despite increasing his dose of benazepril.  He is prescribed Lasix as well but is not on any other antihypertensive medications.  Prior to being bedded in the ED, diagnostic work-up  was initiated.  Results of lab work are notable for creatinine of 2.36.  This is increased when compared to lab work from 12 years ago.  Patient does see a primary care doctor and does have routine lab work.  He is also initiated care with nephrologist, Dr. Kathrene Bongo.  He is unaware of what his baseline creatinine is, however, given that it was 1.71, 12 years ago and he has since established care with nephrology for chronic kidney disease, I suspect that this is close to his baseline.  CBC shows erythrocytosis without leukocytosis.  Electrolytes are normal.  On assessment, patient is well-appearing and continues to deny any symptoms.  SPO2 is normal on room air and lungs are clear to auscultation.  Patient was found to have atrial flutter on EKG.  This was sustained on cardiac monitor.  He denies any known history of atrial fibrillation or atrial flutter.  He is not currently on blood thinning medication.  Patient's CHA2DS2-VASc score is 3.  I did discuss initiation of anticoagulation with patient and wife.  They are agreeable to this.  They are also agreeable to initiation of a second antihypertensive medication.  Dose of Cardizem was given in the ED.  Additional Cardizem as well as Eliquis were prescribed.  Patient currently has a PCP appointment scheduled for  tomorrow.  They were advised to have further discussions regarding blood pressure and new onset of atrial flutter with PCP.  Patient was discharged in good condition. I ordered medication including ***  for ***  Reevaluation of the patient after these medicines showed that the patient {resolved/improved/worsened:23923::"improved"} I have reviewed the patients home medicines and have made adjustments as needed   Social Determinants of Health:  ***   Test / Admission - Considered:  ***   {Document critical care time when appropriate:1} {Document review of labs and clinical decision tools ie heart score, Chads2Vasc2 etc:1}  {Document your independent review of radiology images, and any outside records:1} {Document your discussion with family members, caretakers, and with consultants:1} {Document social determinants of health affecting pt's care:1} {Document your decision making why or why not admission, treatments were needed:1} Final Clinical Impression(s) / ED Diagnoses Final diagnoses:  None    Rx / DC Orders ED Discharge Orders     None

## 2022-08-04 NOTE — ED Provider Triage Note (Signed)
Emergency Medicine Provider Triage Evaluation Note  [name] , a 66 y.o. male  was evaluated in triage.  Pt complains of elevated blood pressure that has been steadily increasing over the last couple of weeks.  Patient is on numerous hypertension medications and states there was about 4 days of missed medications about 2 weeks ago which is when his blood pressure started getting higher.  He is accompanied by his wife.  He denies chest pain, shortness of breath, headache, blurred vision or dizziness.  Review of Systems  Positive:  Negative:   Physical Exam  BP (!) 160/114 (BP Location: Right Arm)   Pulse 87   Temp 98 F (36.7 C) (Oral)   Resp 18   SpO2 93%  Gen:   Awake, no distress   Resp:  Normal effort  MSK:   Moves extremities without difficulty  Other:    Medical Decision Making  Medically screening exam initiated at 2:22 PM.  Appropriate orders placed.  [name] was informed that the remainder of the evaluation will be completed by another provider, this initial triage assessment does not replace that evaluation, and the importance of remaining in the ED until their evaluation is complete.     [name], [place] 08/04/22 1425

## 2022-08-04 NOTE — Discharge Instructions (Addendum)
There were new prescriptions that were sent to your pharmacy: -Diltiazem is a blood pressure medication.  Take this as prescribed.  Continue to monitor your blood pressures at home and talk to your primary care doctor about any further needed medications or dose changes. -Eliquis is a blood thinner.  This is taken to reduce stroke risk in the setting of atrial fibrillation/atrial flutter.  Discussed this new medication with your primary care doctor as well.  Continue to take your daily benazepril and Lasix.  Follow-up with your primary care doctor as scheduled tomorrow.  Return to the emergency department for any new or worsening symptoms of concern.  Information on my medicine - ELIQUIS (apixaban)  Why was Eliquis prescribed for you? Eliquis was prescribed for you to reduce the risk of a blood clot forming that can cause a stroke if you have a medical condition called atrial fibrillation (a type of irregular heartbeat).  What do You need to know about Eliquis ? Take your Eliquis TWICE DAILY - one tablet in the morning and one tablet in the evening with or without food. If you have difficulty swallowing the tablet whole please discuss with your pharmacist how to take the medication safely.  Take Eliquis exactly as prescribed by your doctor and DO NOT stop taking Eliquis without talking to the doctor who prescribed the medication.  Stopping may increase your risk of developing a stroke.  Refill your prescription before you run out.  After discharge, you should have regular check-up appointments with your healthcare provider that is prescribing your Eliquis.  In the future your dose may need to be changed if your kidney function or weight changes by a significant amount or as you get older.  What do you do if you miss a dose? If you miss a dose, take it as soon as you remember on the same day and resume taking twice daily.  Do not take more than one dose of ELIQUIS at the same time to make  up a missed dose.  Important Safety Information A possible side effect of Eliquis is bleeding. You should call your healthcare provider right away if you experience any of the following: Bleeding from an injury or your nose that does not stop. Unusual colored urine (red or dark brown) or unusual colored stools (red or black). Unusual bruising for unknown reasons. A serious fall or if you hit your head (even if there is no bleeding).  Some medicines may interact with Eliquis and might increase your risk of bleeding or clotting while on Eliquis. To help avoid this, consult your healthcare provider or pharmacist prior to using any new prescription or non-prescription medications, including herbals, vitamins, non-steroidal anti-inflammatory drugs (NSAIDs) and supplements.  This website has more information on Eliquis (apixaban): http://www.eliquis.com/eliquis/home

## 2022-08-04 NOTE — ED Triage Notes (Signed)
Pt arrived via POV, c/o HTN. States hx of same, BP has been steadily climbing x1 wk. No recent changes in medication management. Denies any headache or visual issues.

## 2022-08-05 ENCOUNTER — Telehealth (HOSPITAL_COMMUNITY): Payer: Self-pay

## 2022-08-05 NOTE — Telephone Encounter (Signed)
Contacted patient's wife regarding ED follow up appointment. Patient's wife declined appointment to be seen at the [facility] at this time. She is waiting to hear back from Cardiology.

## 2022-09-18 ENCOUNTER — Encounter: Payer: Self-pay | Admitting: Cardiology

## 2022-09-18 ENCOUNTER — Ambulatory Visit (INDEPENDENT_AMBULATORY_CARE_PROVIDER_SITE_OTHER): Payer: Medicare Other

## 2022-09-18 ENCOUNTER — Ambulatory Visit: Payer: Medicare Other | Attending: Cardiology | Admitting: Cardiology

## 2022-09-18 VITALS — BP 124/62 | HR 100 | Ht 70.0 in | Wt 234.4 lb

## 2022-09-18 DIAGNOSIS — E119 Type 2 diabetes mellitus without complications: Secondary | ICD-10-CM | POA: Insufficient documentation

## 2022-09-18 DIAGNOSIS — I4891 Unspecified atrial fibrillation: Secondary | ICD-10-CM

## 2022-09-18 DIAGNOSIS — Z79899 Other long term (current) drug therapy: Secondary | ICD-10-CM

## 2022-09-18 DIAGNOSIS — F319 Bipolar disorder, unspecified: Secondary | ICD-10-CM | POA: Insufficient documentation

## 2022-09-18 DIAGNOSIS — I4892 Unspecified atrial flutter: Secondary | ICD-10-CM | POA: Insufficient documentation

## 2022-09-18 LAB — CBC
Hematocrit: 53.2 % — ABNORMAL HIGH (ref 37.5–51.0)
Hemoglobin: 18.3 g/dL — ABNORMAL HIGH (ref 13.0–17.7)
MCH: 32.3 pg (ref 26.6–33.0)
MCHC: 34.4 g/dL (ref 31.5–35.7)
MCV: 94 fL (ref 79–97)
Platelets: 148 10*3/uL — ABNORMAL LOW (ref 150–450)
RBC: 5.66 x10E6/uL (ref 4.14–5.80)
RDW: 13.1 % (ref 11.6–15.4)
WBC: 6.7 10*3/uL (ref 3.4–10.8)

## 2022-09-18 NOTE — Progress Notes (Unsigned)
Enrolled patient for a 3 day Zio XT monitor to be mailed to patients home  

## 2022-09-18 NOTE — Patient Instructions (Addendum)
Medication Instructions:    Start taking Cardizem ( diltiazem ) 120 mg  daily     *If you need a refill on your cardiac medications before your next appointment, please call your pharmacy*   Lab Work:  CBC     Testing/Procedures: Will be schedule at [facility] [address] Your physician has requested that you have an echocardiogram. Echocardiography is a painless test that uses sound waves to create images of your heart. It provides your doctor with information about the size and shape of your heart and how well your heart's chambers and valves are working. This procedure takes approximately one hour. There are no restrictions for this procedure. Please do NOT wear cologne, perfume, aftershave, or lotions (deodorant is allowed). Please arrive 15 minutes prior to your appointment time.     Your monitor will be mailed to you in 3 to 5 days Your physician has recommended that you wear a holter monitor - 3 days ZIO. Holter monitors are medical devices that record the heart's electrical activity. Doctors most often use these monitors to diagnose arrhythmias. Arrhythmias are problems with the speed or rhythm of the heartbeat. The monitor is a small, portable device. You can wear one while you do your normal daily activities. This is usually used to diagnose what is causing palpitations/syncope (passing out).   Follow-Up: At [facility], you and your health needs are our priority.  As part of our continuing mission to provide you with exceptional heart care, we have created designated Provider Care Teams.  These Care Teams include your primary Cardiologist (physician) and Advanced Practice Providers (APPs -  Physician Assistants and Nurse Practitioners) who all work together to provide you with the care you need, when you need it.  We recommend signing up for the patient portal called "MyChart".  Sign up information is provided on this After Visit Summary.  MyChart is used to  connect with patients for Virtual Visits (Telemedicine).  Patients are able to view lab/test results, encounter notes, upcoming appointments, etc.  Non-urgent messages can be sent to your provider as well.   To learn more about what you can do with MyChart, go to [email or web address].    Your next appointment:   1 month(s)  The format for your next appointment:   In Person  Provider:   Rollene Rotunda, MD    Other Instructions    Please monitor   blood pressure at 3 times a week , and bring the reading to the next appointment.    ZIO XT- Long Term Monitor Instructions   Your physician has requested you wear a ZIO patch monitor for 3 days.  This is a single patch monitor. [employer] supplies one patch monitor per enrollment. Additional stickers are not available. Please do not apply patch if you will be having a Nuclear Stress Test,  Echocardiogram, Cardiac CT, MRI, or Chest Xray during the period you would be wearing the  monitor. The patch cannot be worn during these tests. You cannot remove and re-apply the  ZIO XT patch monitor.  Your ZIO patch monitor will be mailed 3 day USPS to your address on file. It may take 3-5 days  to receive your monitor after you have been enrolled.  Once you have received your monitor, please review the enclosed instructions. Your monitor  has already been registered assigning a specific monitor serial # to you.  Billing and Patient Assistance Program Information  We have supplied [employer] with any of your  insurance information on file for billing purposes. [employer] offers a sliding scale Patient Assistance Program for patients that do not have  insurance, or whose insurance does not completely cover the cost of the ZIO monitor.  You must apply for the Patient Assistance Program to qualify for this discounted rate.  To apply, please call [employer] at [phone], select option 4, select option 2, ask to apply for  Patient Assistance Program. Theodore Demark  will ask your household income, and how many people  are in your household. They will quote your out-of-pocket cost based on that information.  [employer] will also be able to set up a 76-month, interest-free payment plan if needed.  Applying the monitor   Shave hair from upper left chest.  Hold abrader disc by orange tab. Rub abrader in 40 strokes over the upper left chest as  indicated in your monitor instructions.  Clean area with 4 enclosed alcohol pads. Let dry.  Apply patch as indicated in monitor instructions. Patch will be placed under collarbone on left  side of chest with arrow pointing upward.  Rub patch adhesive wings for 2 minutes. Remove white label marked "1". Remove the white  label marked "2". Rub patch adhesive wings for 2 additional minutes.  While looking in a mirror, press and release button in center of patch. A small green light will  flash 3-4 times. This will be your only indicator that the monitor has been turned on.  Do not shower for the first 24 hours. You may shower after the first 24 hours.  Press the button if you feel a symptom. You will hear a small click. Record Date, Time and  Symptom in the Patient Logbook.  When you are ready to remove the patch, follow instructions on the last 2 pages of Patient  Logbook. Stick patch monitor onto the last page of Patient Logbook.  Place Patient Logbook in the blue and white box. Use locking tab on box and tape box closed  securely. The blue and white box has prepaid postage on it. Please place it in the mailbox as  soon as possible. Your physician should have your test results approximately 7 days after the  monitor has been mailed back to [facility].  Call [place] at [phone] if you have questions regarding  your ZIO XT patch monitor. Call them immediately if you see an orange light blinking on your  monitor.  If your monitor falls off in less than 4 days, contact our Monitor department at  [phone].  If your monitor becomes loose or falls off after 4 days call [employer] at [phone] for  suggestions on securing your monitor

## 2022-09-18 NOTE — Progress Notes (Addendum)
Cardiology Office Note   Date:  09/18/2022   ID:  [name], DOB 1956/03/31, MRN [id number]  PCP:  Nolene Ebbs, MD  Cardiologist:   Minus Breeding, MD Referring:  ED  Chief Complaint  Patient presents with   Atrial Fibrillation      History of Present Illness: [name] is a 66 y.o. male who presented to the ED for evaluation of hypertension.   This was in Sept.  I reviewed these records for this visit.  His blood pressure when he presented on 9/12 was 200/100.  While in the emergency room he was found to have atrial flutter.  The patient was started on Cardizem for management of blood pressure as well as his heart rhythm.  He was also started on Eliquis.  His wife is actually not been giving him Cardizem as she was told to hold this until he meets with [place].  He is not having any new cardiovascular symptoms.  He denies any palpitations, presyncope or syncope.  He is not having any chest pressure, neck or arm discomfort.  He is not having any weight gain or edema.  He actually would develop Cardizem.  They will he came to the ER because they take his blood pressure occasionally at this time it was very high.  Past Medical History:  Diagnosis Date   Atrial fib    Bipolar affective ([place])    DM (diabetes mellitus) ([place])    Hyperlipemia    Hypertension     Past Surgical History:  Procedure Laterality Date   None       Current Outpatient Medications  Medication Sig Dispense Refill   allopurinol (ZYLOPRIM) 100 MG tablet Take 100 mg by mouth in the morning and at bedtime.     apixaban (ELIQUIS) 5 MG TABS tablet Take 1 tablet (5 mg total) by mouth 2 (two) times daily. 60 tablet 2   benazepril (LOTENSIN) 20 MG tablet Take 20 mg by mouth daily.     calcitRIOL (ROCALTROL) 0.25 MCG capsule Take 0.25 mcg by mouth daily.     diltiazem (CARDIZEM CD) 120 MG 24 hr capsule Take 1 capsule (120 mg total) by mouth daily. 30 capsule 2   empagliflozin (JARDIANCE) 25 MG TABS  tablet Take 25 mg by mouth daily.     ergocalciferol (VITAMIN D2) 1.25 MG (50000 UT) capsule Take 50,000 Units by mouth every Saturday.     furosemide (LASIX) 40 MG tablet Take 40 mg by mouth in the morning.     glimepiride (AMARYL) 4 MG tablet Take 4 mg by mouth 2 (two) times daily.     HUMALOG MIX 75/25 KWIKPEN (75-25) 100 UNIT/ML KwikPen Inject 60 Units into the skin in the morning and at bedtime.     Potassium Chloride ER 20 MEQ TBCR Take 20 mEq by mouth every Monday, Wednesday, and Friday.     simvastatin (ZOCOR) 20 MG tablet Take 20 mg by mouth at bedtime.     ziprasidone (GEODON) 40 MG capsule Take 40 mg by mouth at bedtime.     No current facility-administered medications for this visit.    Allergies:   Patient has no known allergies.    Social History:  The patient  reports that he has quit smoking. His smoking use included cigarettes. He has never used smokeless tobacco.   Family History:  The patient's family history includes Diabetes in his mother; Heart attack (age of onset: 55) in his father.  ROS:  Please see the history of present illness.   Otherwise, review of systems are positive for none.   All other systems are reviewed and negative.    PHYSICAL EXAM: VS:  BP 124/62   Pulse 100   Ht 5\' 10"  (1.778 m)   Wt 234 lb 6.4 oz (106.3 kg)   SpO2 100%   BMI 33.63 kg/m  , BMI Body mass index is 33.63 kg/m. GENERAL:  Well appearing HEENT:  Pupils equal round and reactive, fundi not visualized, oral mucosa unremarkable NECK:  No jugular venous distention, waveform within normal limits, carotid upstroke brisk and symmetric, no bruits, no thyromegaly LYMPHATICS:  No cervical, inguinal adenopathy LUNGS:  Clear to auscultation bilaterally BACK:  No CVA tenderness CHEST:  Unremarkable HEART:  PMI not displaced or sustained,S1 and S2 within normal limits, no S3, no clicks, no rubs, no murmurs, irregular ABD:  Flat, positive bowel sounds normal in frequency in pitch, no  bruits, no rebound, no guarding, no midline pulsatile mass, no hepatomegaly, no splenomegaly EXT:  2 plus pulses throughout, no edema, no cyanosis no clubbing SKIN:  No rashes no nodules NEURO:  Cranial nerves II through XII grossly intact, motor grossly intact throughout PSYCH:  Cognitively intact, oriented to person place and time    EKG:  EKG is ordered today. The ekg ordered today demonstrates atrial fibrillation, rate 100, left axis deviation, left anterior fascicular block, no acute ST-T wave changes.   Recent Labs: 08/04/2022: BUN 18; Creatinine, Ser 2.36; Hemoglobin 17.4; Platelets 154; Potassium 3.7; Sodium 140    Lipid Panel No results found for: "CHOL", "TRIG", "HDL", "CHOLHDL", "VLDL", "LDLCALC", "LDLDIRECT"    Wt Readings from Last 3 Encounters:  09/18/22 234 lb 6.4 oz (106.3 kg)  08/04/22 244 lb (110.7 kg)      Other studies Reviewed: Additional studies/ records that were reviewed today include: ED records. Review of the above records demonstrates:  Please see elsewhere in the note.     ASSESSMENT AND PLAN:  Atrial flutter: Today the rhythm is actually fibrillation.  I am going to put a 3-day monitor on him to see if he has good rate control.  I am going to check an echocardiogram.  CHA2DS2-VASc is 3.  He is already tolerating anticoagulation.  I will check a CBC since he has been on this for a month.  At this point I might pursue rate control and anticoagulation as he actually does not feel this is not sure how long he has been it.  HTN: Blood pressures currently well controlled.  I do want him to take the Cardizem 120 because his rate is slightly elevated.  I have asked his wife to check blood pressure diary.  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  He has followed with Dr. Moshe Cipro.   Current medicines are reviewed at length with the patient today.  The patient does not have concerns regarding medicines.  The following changes have been made:  no  change  Labs/ tests ordered today include:   Orders Placed This Encounter  Procedures   CBC   LONG TERM MONITOR (3-14 DAYS)   EKG 12-Lead   ECHOCARDIOGRAM COMPLETE     Disposition:   FU with me after the above testing   Signed, Minus Breeding, MD  09/18/2022 1:45 PM    [place]

## 2022-09-21 ENCOUNTER — Telehealth: Payer: Self-pay | Admitting: Cardiology

## 2022-09-21 DIAGNOSIS — D582 Other hemoglobinopathies: Secondary | ICD-10-CM

## 2022-09-21 NOTE — Addendum Note (Signed)
Addended by: Betha Loa F on: 09/21/2022 03:33 PM   Modules accepted: Orders

## 2022-09-21 NOTE — Telephone Encounter (Signed)
Spouse returned call to triage. Gave her the CBC results per Dr. Percival Spanish: "Hgb is persistently elevated.  I would like for him to see heme/onc as a new patient." Spouse had no questions or concerns at this time. Order placed for referral and results faxed to Dr. Jeanie Cooks.

## 2022-09-21 NOTE — Telephone Encounter (Signed)
LMTCB

## 2022-09-21 NOTE — Telephone Encounter (Signed)
Pt is returning call. Requesting call back.  

## 2022-09-25 ENCOUNTER — Telehealth: Payer: Self-pay | Admitting: Hematology and Oncology

## 2022-09-25 NOTE — Telephone Encounter (Signed)
Scheduled appointment per 10/30 referral. Patient is aware of appointment date and time. Patient is aware to arrive 15 mins prior to appointment time and to bring updated insurance cards. Patient is aware of location.

## 2022-09-29 DIAGNOSIS — I4891 Unspecified atrial fibrillation: Secondary | ICD-10-CM | POA: Diagnosis not present

## 2022-10-01 ENCOUNTER — Telehealth: Payer: Self-pay | Admitting: Cardiology

## 2022-10-01 NOTE — Telephone Encounter (Signed)
Dr. Antoine Poche ordered for patient to take all medications as scheduled tomorrow and hold the eliquis tonight. Continue to monitor BP. Wife advised of orders and repeated them back to me in her own words.

## 2022-10-01 NOTE — Telephone Encounter (Signed)
Patient's wife is following up, to report BP reading shortly after 4:00 PM as advised by RN. BP was 126/94 51.

## 2022-10-01 NOTE — Telephone Encounter (Signed)
Pt c/o medication issue:  1. Name of Medication:  diltiazem (CARDIZEM CD) 120 MG 24 hr capsule took twice Potassium Chloride ER 20 MEQ TBCR took today instead of Friday calcitRIOL (ROCALTROL) 0.25 MCG capsule took twice furosemide (LASIX) 40 MG tablet took twice empagliflozin (JARDIANCE) 25 MG TABS tablet took twice   2. How are you currently taking this medication (dosage and times per day)? 1 tablet daily usually  3. Are you having a reaction (difficulty breathing--STAT)? no  4. What is your medication issue? Patient's wife states the patient accidentally took 2 of all his medications. She says the medications he takes twice a day she is not concerned about, because she separated his pills for morning and evening. She says the patient took today's morning and Friday's morning pills by mistake. She states he is not having symptoms, but wants to know if this is okay.

## 2022-10-01 NOTE — Telephone Encounter (Addendum)
Wife stated that at 11:30 and at 2 pm today patient took: Cardizem 120mg  Lotensin 20mg  Eliquis 5mg  Jardiance 25mg  Calcitrol 0.83mcg Lasix 40mg  KCL today instead of Friday's dose  BP 111/79, P 80 Wife sates patient feels fine. Denies dizziness  At 4 pm BP 126/94, P 61. Patient  "feels fine."  Told wife not to give evening dose of eliquis Had her push the Zio button at 2:54  Recommended to only put each day's medication, not the week. Recommended she check BP at 4 pm and call clinic.  Please advise on medications for tomorrow/treatment plan.

## 2022-10-08 ENCOUNTER — Ambulatory Visit (HOSPITAL_COMMUNITY): Payer: Medicare Other | Attending: Internal Medicine

## 2022-10-08 DIAGNOSIS — I4891 Unspecified atrial fibrillation: Secondary | ICD-10-CM | POA: Diagnosis present

## 2022-10-09 LAB — ECHOCARDIOGRAM COMPLETE
Area-P 1/2: 5.13 cm2
P 1/2 time: 956 msec
S' Lateral: 2.7 cm

## 2022-10-12 ENCOUNTER — Encounter: Payer: Self-pay | Admitting: *Deleted

## 2022-10-16 ENCOUNTER — Inpatient Hospital Stay: Payer: Medicare Other | Attending: Hematology and Oncology | Admitting: Hematology and Oncology

## 2022-10-16 ENCOUNTER — Inpatient Hospital Stay: Payer: Medicare Other

## 2022-10-18 DIAGNOSIS — I4819 Other persistent atrial fibrillation: Secondary | ICD-10-CM | POA: Insufficient documentation

## 2022-10-18 DIAGNOSIS — I10 Essential (primary) hypertension: Secondary | ICD-10-CM | POA: Insufficient documentation

## 2022-10-18 NOTE — Progress Notes (Signed)
Cardiology Office Note   Date:  10/21/2022   ID:  [name], DOB [birth year], MRN [id number]  PCP:  [name], MD  Cardiologist:   Rollene Rotunda, MD  Chief Complaint  Patient presents with   Atrial Fibrillation      History of Present Illness: [name] is a 66 y.o. male who presented to the ED for evaluation of hypertension.   This was in Sept.  I reviewed these records for this visit.  His blood pressure when he presented on 9/12 was 200/100.  While in the emergency room he was found to have atrial flutter.  The patient was started on Cardizem for management of blood pressure as well as his heart rhythm.  He was also started on Eliquis.  He did wear a monitor and he had well-controlled heart rate with persistent atrial fibrillation.  He had no significant findings on his echocardiogram.  He has not noticed his atrial fibrillation. The patient denies any new symptoms such as chest discomfort, neck or arm discomfort. There has been no new shortness of breath, PND or orthopnea. There have been no reported palpitations, presyncope or syncope.    Past Medical History:  Diagnosis Date   Atrial fib    Bipolar affective ([facility])    DM (diabetes mellitus) ([facility])    Hyperlipemia    Hypertension     Past Surgical History:  Procedure Laterality Date   None       Current Outpatient Medications  Medication Sig Dispense Refill   allopurinol (ZYLOPRIM) 100 MG tablet Take 100 mg by mouth in the morning and at bedtime.     apixaban (ELIQUIS) 5 MG TABS tablet Take 1 tablet (5 mg total) by mouth 2 (two) times daily. 60 tablet 2   benazepril (LOTENSIN) 20 MG tablet Take 20 mg by mouth daily.     calcitRIOL (ROCALTROL) 0.25 MCG capsule Take 0.25 mcg by mouth daily.     diltiazem (CARDIZEM CD) 120 MG 24 hr capsule Take 1 capsule (120 mg total) by mouth daily. 30 capsule 2   empagliflozin (JARDIANCE) 25 MG TABS tablet Take 25 mg by mouth daily.     ergocalciferol (VITAMIN  D2) 1.25 MG (50000 UT) capsule Take 50,000 Units by mouth every Saturday.     furosemide (LASIX) 40 MG tablet Take 40 mg by mouth in the morning.     glimepiride (AMARYL) 4 MG tablet Take 4 mg by mouth 2 (two) times daily.     HUMALOG MIX 75/25 KWIKPEN (75-25) 100 UNIT/ML KwikPen Inject 60 Units into the skin in the morning and at bedtime.     Potassium Chloride ER 20 MEQ TBCR Take 20 mEq by mouth every Monday, Wednesday, and Friday.     simvastatin (ZOCOR) 20 MG tablet Take 20 mg by mouth at bedtime.     ziprasidone (GEODON) 40 MG capsule Take 40 mg by mouth at bedtime.     No current facility-administered medications for this visit.    Allergies:   Patient has no known allergies.    ROS:  Please see the history of present illness.   Otherwise, review of systems are positive for none.   All other systems are reviewed and negative.     PHYSICAL EXAM: VS:  BP 136/78   Pulse 98   Ht 5\' 9"  (1.753 m)   Wt 239 lb (108.4 kg)   SpO2 100%   BMI 35.29 kg/m  , BMI Body mass index is  35.29 kg/m. GENERAL:  Well appearing NECK:  No jugular venous distention, waveform within normal limits, carotid upstroke brisk and symmetric, no bruits, no thyromegaly LUNGS:  Clear to auscultation bilaterally CHEST:  Unremarkable HEART:  PMI not displaced or sustained,S1 and S2 within normal limits, no S3, no clicks, no rubs, no murmurs, irregular  ABD:  Flat, positive bowel sounds normal in frequency in pitch, no bruits, no rebound, no guarding, no midline pulsatile mass, no hepatomegaly, no splenomegaly EXT:  2 plus pulses throughout, no edema, no cyanosis no clubbing   EKG:  EKG is not ordered today.   Recent Labs: 08/04/2022: BUN 18; Creatinine, Ser 2.36; Potassium 3.7; Sodium 140 09/18/2022: Hemoglobin 18.3; Platelets 148    Lipid Panel No results found for: "CHOL", "TRIG", "HDL", "CHOLHDL", "VLDL", "LDLCALC", "LDLDIRECT"    Wt Readings from Last 3 Encounters:  10/21/22 239 lb (108.4 kg)   09/18/22 234 lb 6.4 oz (106.3 kg)  08/04/22 244 lb (110.7 kg)      Other studies Reviewed: Additional studies/ records that were reviewed today include:   Monitor and echo  Review of the above records demonstrates:  Please see elsewhere in the note.     ASSESSMENT AND PLAN:  Atrial flutter/fibrillation:   Patient tolerates anticoagulation.  He has good rate control.  He does not notice his atrial fibrillation.  I had a discussion with him about further management to include ablation and he and his spouse would prefer conservative therapy with rate control and anticoagulation.  CHA2DS2-VASc is 3.   HTN: Blood pressure is controlled.  No change in therapy.   CKD IV: He has followed with Dr. Kathrene Bongo.   Current medicines are reviewed at length with the patient today.  The patient does not have concerns regarding medicines.  The following changes have been made:  None  Labs/ tests ordered today include: None  No orders of the defined types were placed in this encounter.    Disposition:   FU with me APP in 6 months.   Signed, Rollene Rotunda, MD  10/21/2022 10:02 AM    [facility]

## 2022-10-19 ENCOUNTER — Encounter: Payer: Self-pay | Admitting: *Deleted

## 2022-10-21 ENCOUNTER — Ambulatory Visit: Payer: Medicare Other | Attending: Cardiology | Admitting: Cardiology

## 2022-10-21 ENCOUNTER — Encounter: Payer: Self-pay | Admitting: Cardiology

## 2022-10-21 VITALS — BP 136/78 | HR 98 | Ht 69.0 in | Wt 239.0 lb

## 2022-10-21 DIAGNOSIS — I10 Essential (primary) hypertension: Secondary | ICD-10-CM

## 2022-10-21 DIAGNOSIS — I4819 Other persistent atrial fibrillation: Secondary | ICD-10-CM | POA: Diagnosis not present

## 2022-10-21 NOTE — Patient Instructions (Signed)
    Follow-Up: At [facility], you and your health needs are our priority.  As part of our continuing mission to provide you with exceptional heart care, we have created designated Provider Care Teams.  These Care Teams include your primary Cardiologist (physician) and Advanced Practice Providers (APPs -  Physician Assistants and Nurse Practitioners) who all work together to provide you with the care you need, when you need it.  We recommend signing up for the patient portal called "MyChart".  Sign up information is provided on this After Visit Summary.  MyChart is used to connect with patients for Virtual Visits (Telemedicine).  Patients are able to view lab/test results, encounter notes, upcoming appointments, etc.  Non-urgent messages can be sent to your provider as well.   To learn more about what you can do with MyChart, go to [email or web address].    Your next appointment:   6 month(s)  The format for your next appointment:   In Person  Provider:   ANY APP

## 2022-12-07 ENCOUNTER — Telehealth: Payer: Self-pay | Admitting: Cardiology

## 2022-12-07 MED ORDER — DILTIAZEM HCL ER COATED BEADS 120 MG PO CP24: 120.0000 mg | ORAL_CAPSULE | Freq: Every day | ORAL | 2 refills | Status: DC

## 2022-12-07 NOTE — Telephone Encounter (Signed)
*  STAT* If patient is at the pharmacy, call can be transferred to refill team.   1. Which medications need to be refilled? (please list name of each medication and dose if known)  apixaban (ELIQUIS) 5 MG TABS tablet (Expired) diltiazem (CARDIZEM CD) 120 MG 24 hr capsule    2. Which pharmacy/location (including street and city if local pharmacy) is medication to be sent to?  [place], [place] - [address] AT [place]    3. Do they need a 30 day or 90 day supply? [place]

## 2022-12-15 ENCOUNTER — Telehealth: Payer: Self-pay | Admitting: Cardiology

## 2022-12-15 MED ORDER — APIXABAN 5 MG PO TABS: 5.0000 mg | ORAL_TABLET | Freq: Two times a day (BID) | ORAL | 5 refills | Status: DC

## 2022-12-15 NOTE — Telephone Encounter (Signed)
Prescription refill request for Eliquis received. Indication: AF Last office visit:  10/21/22  Vita Barley MD Scr: 2.36 on 08/04/22 Age: 67 Weight: 108.4kg  Based on above findings Eliquis 5mg  twice daily is the appropriate dose.  Refill approved.

## 2022-12-15 NOTE — Telephone Encounter (Signed)
*  STAT* If patient is at the pharmacy, call can be transferred to refill team.   1. Which medications need to be refilled? (please list name of each medication and dose if known)  apixaban (ELIQUIS) 5 MG TABS tablet (Expired)   2. Which pharmacy/location (including street and city if local pharmacy) is medication to be sent to?  [place], [place] - [address] AT [place]    3. Do they need a 30 day or 90 day supply? [place]

## 2023-04-05 ENCOUNTER — Other Ambulatory Visit: Payer: Self-pay | Admitting: Cardiology

## 2023-04-20 ENCOUNTER — Ambulatory Visit: Payer: 59 | Attending: Nurse Practitioner | Admitting: Nurse Practitioner

## 2023-04-20 ENCOUNTER — Encounter: Payer: Self-pay | Admitting: Nurse Practitioner

## 2023-04-20 VITALS — BP 122/78 | HR 82 | Ht 70.0 in | Wt 241.0 lb

## 2023-04-20 DIAGNOSIS — I482 Chronic atrial fibrillation, unspecified: Secondary | ICD-10-CM | POA: Diagnosis not present

## 2023-04-20 DIAGNOSIS — E1122 Type 2 diabetes mellitus with diabetic chronic kidney disease: Secondary | ICD-10-CM | POA: Diagnosis not present

## 2023-04-20 DIAGNOSIS — E782 Mixed hyperlipidemia: Secondary | ICD-10-CM

## 2023-04-20 DIAGNOSIS — N184 Chronic kidney disease, stage 4 (severe): Secondary | ICD-10-CM

## 2023-04-20 DIAGNOSIS — I10 Essential (primary) hypertension: Secondary | ICD-10-CM | POA: Diagnosis not present

## 2023-04-20 DIAGNOSIS — Z794 Long term (current) use of insulin: Secondary | ICD-10-CM

## 2023-04-20 NOTE — Patient Instructions (Signed)
Medication Instructions:  No Changes In Medications at this time.   *If you need a refill on your cardiac medications before your next appointment, please call your pharmacy*  Follow-Up: At [facility], you and your health needs are our priority.  As part of our continuing mission to provide you with exceptional heart care, we have created designated Provider Care Teams.  These Care Teams include your primary Cardiologist (physician) and Advanced Practice Providers (APPs -  Physician Assistants and Nurse Practitioners) who all work together to provide you with the care you need, when you need it.  Your next appointment:   6 month(s)  Provider:   Rollene Rotunda, MD

## 2023-04-20 NOTE — Progress Notes (Addendum)
Office Visit    Patient Name: [name] Date of Encounter: 04/20/2023  Primary Care Provider:  [name], MD Primary Cardiologist:  Rollene Rotunda, MD  Chief Complaint    67 year old male with a history of persistent atrial fibrillation/atrial flutter, hypertension, hyperlipidemia, type 2 diabetes, and CKD stage IV who presents for follow-up related to atrial fibrillation.  Past Medical History    Past Medical History:  Diagnosis Date   Atrial fib    Bipolar affective (HCC)    DM (diabetes mellitus) (HCC)    Hyperlipemia    Hypertension    Past Surgical History:  Procedure Laterality Date   None      Allergies  No Known Allergies   Labs/Other Studies Reviewed    The following studies were reviewed today:  Cardiac Studies & Procedures       ECHOCARDIOGRAM  ECHOCARDIOGRAM COMPLETE 10/09/2022  Narrative ECHOCARDIOGRAM REPORT    Patient Name:   [name] Date of Exam: 10/08/2022 Medical Rec #:  [id number]        Height:       70.0 in Accession #:    [id number]       Weight:       234.4 lb Date of Birth:  [birth year]       BSA:          2.233 m Patient Age:    66 years         BP:           124/62 mmHg Patient Gender: M                HR:           58 bpm. Exam Location:  [address]  Procedure: 2D Echo, 3D Echo, Cardiac Doppler, Color Doppler and Strain Analysis  Indications:    I48.91 Atrial fibrillation  History:        Patient has no prior history of Echocardiogram examinations. Arrythmias:Atrial Fibrillation; Risk Factors:Hypertension, Diabetes, Dyslipidemia and Former Smoker.  Sonographer:    Jorje Guild BS, RDCS Referring Phys: [id number] JAMES HOCHREIN  IMPRESSIONS   1. Left ventricular ejection fraction, by estimation, is 60 to 65%. Left ventricular ejection fraction by 3D volume is 65 %. The left ventricle has normal function. The left ventricle has no regional wall motion abnormalities. There is moderate left ventricular  hypertrophy. Left ventricular diastolic parameters are indeterminate. The average left ventricular global longitudinal strain is -25.8 %. The global longitudinal strain is normal. 2. Right ventricular systolic function is normal. The right ventricular size is normal. There is normal pulmonary artery systolic pressure. The estimated right ventricular systolic pressure is 25.7 mmHg. 3. Left atrial size was mildly dilated. 4. The mitral valve is normal in structure. Trivial mitral valve regurgitation. No evidence of mitral stenosis. 5. The aortic valve is tricuspid. Aortic valve regurgitation is mild, however splay artifact is present and this suggests regurgitation is underestimated on transthoracic echo. No aortic stenosis is present. 6. The inferior vena cava is normal in size with greater than 50% respiratory variability, suggesting right atrial pressure of 3 mmHg. 7. Cannot exclude a small PFO.  FINDINGS Left Ventricle: Left ventricular ejection fraction, by estimation, is 60 to 65%. Left ventricular ejection fraction by 3D volume is 65 %. The left ventricle has normal function. The left ventricle has no regional wall motion abnormalities. The average left ventricular global longitudinal strain is -25.8 %. The global longitudinal strain is normal. The left ventricular internal  cavity size was normal in size. There is moderate left ventricular hypertrophy. Left ventricular diastolic parameters are indeterminate.  Right Ventricle: The right ventricular size is normal. No increase in right ventricular wall thickness. Right ventricular systolic function is normal. There is normal pulmonary artery systolic pressure. The tricuspid regurgitant velocity is 2.38 m/s, and with an assumed right atrial pressure of 3 mmHg, the estimated right ventricular systolic pressure is 25.7 mmHg.  Left Atrium: Left atrial size was mildly dilated.  Right Atrium: Right atrial size was normal in size.  Pericardium: There  is no evidence of pericardial effusion.  Mitral Valve: The mitral valve is normal in structure. Trivial mitral valve regurgitation. No evidence of mitral valve stenosis.  Tricuspid Valve: The tricuspid valve is normal in structure. Tricuspid valve regurgitation is mild . No evidence of tricuspid stenosis.  Aortic Valve: The aortic valve is tricuspid. Aortic valve regurgitation is mild. Aortic regurgitation PHT measures 956 msec. No aortic stenosis is present.  Pulmonic Valve: The pulmonic valve was normal in structure. Pulmonic valve regurgitation is mild. No evidence of pulmonic stenosis.  Aorta: The aortic root is normal in size and structure.  Venous: The inferior vena cava is normal in size with greater than 50% respiratory variability, suggesting right atrial pressure of 3 mmHg.  IAS/Shunts: Cannot exclude a small PFO.   LEFT VENTRICLE PLAX 2D LVIDd:         4.40 cm         Diastology LVIDs:         2.70 cm         LV e' medial:    6.38 cm/s LV PW:         1.50 cm         LV E/e' medial:  12.6 LV IVS:        1.40 cm         LV e' lateral:   8.33 cm/s LVOT diam:     2.20 cm         LV E/e' lateral: 9.6 LV SV:         76 LV SV Index:   34              2D LVOT Area:     3.80 cm        Longitudinal Strain 2D Strain GLS  -24.7 % (A2C): 2D Strain GLS  -22.4 % (A3C): 2D Strain GLS  -30.4 % (A4C): 2D Strain GLS  -25.8 % Avg:  3D Volume EF LV 3D EF:    Left ventricul ar ejection fraction by 3D volume is 65 %.  3D Volume EF: 3D EF:        65 % LV EDV:       130 ml LV ESV:       45 ml LV SV:        84 ml  RIGHT VENTRICLE             IVC RV Basal diam:  3.80 cm     IVC diam: 1.70 cm RV S prime:     14.80 cm/s TAPSE (M-mode): 2.8 cm RVSP:           25.7 mmHg  LEFT ATRIUM             Index        RIGHT ATRIUM           Index LA diam:        5.40 cm 2.42 cm/m  RA Pressure: 3.00 mmHg LA Vol (A2C):   83.3 ml 37.30 ml/m  RA Area:     11.70 cm LA Vol (A4C):   77.5  ml 34.71 ml/m  RA Volume:   25.00 ml  11.20 ml/m LA Biplane Vol: 80.7 ml 36.14 ml/m AORTIC VALVE LVOT Vmax:   89.70 cm/s LVOT Vmean:  58.800 cm/s LVOT VTI:    0.200 m AI PHT:      956 msec  AORTA Ao Root diam: 3.30 cm Ao Asc diam:  3.40 cm  MITRAL VALVE               TRICUSPID VALVE TR Peak grad:   22.7 mmHg MV Decel Time: 148 msec    TR Vmax:        238.00 cm/s MV E velocity: 80.10 cm/s  Estimated RAP:  3.00 mmHg MV A velocity: 37.50 cm/s  RVSP:           25.7 mmHg MV E/A ratio:  2.14 SHUNTS Systemic VTI:  0.20 m Systemic Diam: 2.20 cm  Weston Brass MD Electronically signed by Weston Brass MD Signature Date/Time: 10/09/2022/1:31:40 PM    Final    MONITORS  LONG TERM MONITOR (3-14 DAYS) 10/15/2022  Narrative Atrial fibrillation with overall good rate control. One four beat run of NSVT Rare isolated ventricular ectopy with bigeminy and trigeminy noted.          Recent Labs: 08/04/2022: BUN 18; Creatinine, Ser 2.36; Potassium 3.7; Sodium 140 09/18/2022: Hemoglobin 18.3; Platelets 148  Recent Lipid Panel No results found for: "CHOL", "TRIG", "HDL", "CHOLHDL", "VLDL", "LDLCALC", "LDLDIRECT"  History of Present Illness    67 year old male with the above past medical history including persistent atrial fibrillation/atrial flutter, hypertension, hyperlipidemia, type 2 diabetes, and CKD stage IV.   He has a history of atrial fibrillation/atrial flutter, rate controlled, on Eliquis.  He has declined further management strategies including ablation and has instead opted for conservative therapy with rate control and anticoagulation.  Echocardiogram in 09/2022 revealed EF 60 to 65%, no RWMA, normal LV function, moderate LVH, normal RV systolic function, mild aortic valve regurgitation, unable to exclude small PFO.  Cardiac monitor in 09/2022 revealed rate controlled atrial fibrillation, rare PVCs, one 4 beat run of NSVT.  He was last seen in the office on 10/21/2022 and  was doing well from a cardiac standpoint.  BP was well-controlled.  He denies any significant palpitations.  He presents today for follow-up accompanied by his wife.  Since his last visit he has done well from a cardiac standpoint.  He denies any palpitations, dizziness, chest pain, dyspnea, edema, PND, orthopnea, weight gain.  Overall, he reports feeling well.  Home Medications    Current Outpatient Medications  Medication Sig Dispense Refill   allopurinol (ZYLOPRIM) 100 MG tablet Take 100 mg by mouth in the morning and at bedtime.     apixaban (ELIQUIS) 5 MG TABS tablet Take 1 tablet (5 mg total) by mouth 2 (two) times daily. 60 tablet 5   benazepril (LOTENSIN) 20 MG tablet Take 20 mg by mouth daily.     calcitRIOL (ROCALTROL) 0.25 MCG capsule Take 0.25 mcg by mouth daily.     diltiazem (CARDIZEM CD) 120 MG 24 hr capsule TAKE 1 CAPSULE(120 MG) BY MOUTH DAILY 90 capsule 1   empagliflozin (JARDIANCE) 25 MG TABS tablet Take 25 mg by mouth daily.     ergocalciferol (VITAMIN D2) 1.25 MG (50000 UT) capsule Take 50,000 Units by mouth every Saturday.  furosemide (LASIX) 40 MG tablet Take 40 mg by mouth in the morning.     glimepiride (AMARYL) 4 MG tablet Take 4 mg by mouth 2 (two) times daily.     HUMALOG MIX 75/25 KWIKPEN (75-25) 100 UNIT/ML KwikPen Inject 60 Units into the skin in the morning and at bedtime.     Potassium Chloride ER 20 MEQ TBCR Take 20 mEq by mouth every Monday, Wednesday, and Friday.     simvastatin (ZOCOR) 20 MG tablet Take 20 mg by mouth at bedtime.     ziprasidone (GEODON) 40 MG capsule Take 40 mg by mouth at bedtime.     No current facility-administered medications for this visit.     Review of Systems    He denies chest pain, palpitations, dyspnea, pnd, orthopnea, n, v, dizziness, syncope, edema, weight gain, or early satiety. All other systems reviewed and are otherwise negative except as noted above.   Physical Exam    VS:  BP 122/78 (BP Location: Left Arm,  Patient Position: Sitting, Cuff Size: Large)   Pulse 82   Ht 5\' 10"  (1.778 m)   Wt 241 lb (109.3 kg)   SpO2 100%   BMI 34.58 kg/m   GEN: Well nourished, well developed, in no acute distress. HEENT: normal. Neck: Supple, no JVD, carotid bruits, or masses. Cardiac: IRIR, no murmurs, rubs, or gallops. No clubbing, cyanosis, edema.  Radials/DP/PT 2+ and equal bilaterally.  Respiratory:  Respirations regular and unlabored, clear to auscultation bilaterally. GI: Soft, nontender, nondistended, BS + x 4. MS: no deformity or atrophy. Skin: warm and dry, no rash. Neuro:  Strength and sensation are intact. Psych: Normal affect.  Accessory Clinical Findings    ECG personally reviewed by me today -No EKG in office today.    Lab Results  Component Value Date   WBC 6.7 09/18/2022   HGB 18.3 (H) 09/18/2022   HCT 53.2 (H) 09/18/2022   MCV 94 09/18/2022   PLT 148 (L) 09/18/2022   Lab Results  Component Value Date   CREATININE 2.36 (H) 08/04/2022   BUN 18 08/04/2022   NA 140 08/04/2022   K 3.7 08/04/2022   CL 110 08/04/2022   CO2 21 (L) 08/04/2022   Lab Results  Component Value Date   ALT 42 04/09/2010   AST 28 04/09/2010   ALKPHOS 55 04/09/2010   BILITOT 0.9 04/09/2010   No results found for: "CHOL", "HDL", "LDLCALC", "LDLDIRECT", "TRIG", "CHOLHDL"  No results found for: "HGBA1C"  Assessment & Plan   1. Chronic atrial fibrillation: Rate controlled. Continue diltiazem, Eliquis.  2. Hypertension: BP well controlled. Continue current antihypertensive regimen.   3. Hyperlipidemia: No recent LDL on file.  Will request most recent labs from PCP.  Continue simvastatin.  4. Type 2 diabetes: A1c was 7.7 in 01/2023.  Monitored and managed per PCP.  5. CKD stage IV: Creatinine was 3.15 in 01/2023.  Follows with nephrology.   6. Disposition: Follow-up in 6 months.      Joylene Grapes, NP 04/20/2023, 9:37 AM

## 2023-05-19 ENCOUNTER — Other Ambulatory Visit: Payer: Self-pay | Admitting: Internal Medicine

## 2023-05-20 LAB — COMPLETE METABOLIC PANEL WITH GFR
AG Ratio: 1.9 (calc) (ref 1.0–2.5)
ALT: 15 U/L (ref 9–46)
AST: 18 U/L (ref 10–35)
Albumin: 3.7 g/dL (ref 3.6–5.1)
Alkaline phosphatase (APISO): 51 U/L (ref 35–144)
BUN/Creatinine Ratio: 9 (calc) (ref 6–22)
BUN: 27 mg/dL — ABNORMAL HIGH (ref 7–25)
CO2: 17 mmol/L — ABNORMAL LOW (ref 20–32)
Calcium: 9 mg/dL (ref 8.6–10.3)
Chloride: 112 mmol/L — ABNORMAL HIGH (ref 98–110)
Creat: 2.9 mg/dL — ABNORMAL HIGH (ref 0.70–1.35)
Globulin: 1.9 g/dL (calc) (ref 1.9–3.7)
Glucose, Bld: 94 mg/dL (ref 65–99)
Potassium: 4.3 mmol/L (ref 3.5–5.3)
Sodium: 142 mmol/L (ref 135–146)
Total Bilirubin: 0.5 mg/dL (ref 0.2–1.2)
Total Protein: 5.6 g/dL — ABNORMAL LOW (ref 6.1–8.1)
eGFR: 23 mL/min/{1.73_m2} — ABNORMAL LOW (ref >=60)

## 2023-05-20 LAB — LIPID PANEL
Cholesterol: 153 mg/dL (ref <200)
HDL: 48 mg/dL (ref >=40)
LDL Cholesterol (Calc): 81 mg/dL (calc)
Non-HDL Cholesterol (Calc): 105 mg/dL (calc) (ref <130)
Total CHOL/HDL Ratio: 3.2 (calc) (ref <5.0)
Triglycerides: 143 mg/dL (ref <150)

## 2023-05-20 LAB — CBC
HCT: 45.8 % (ref 38.5–50.0)
Hemoglobin: 15.1 g/dL (ref 13.2–17.1)
MCH: 31.9 pg (ref 27.0–33.0)
MCHC: 33 g/dL (ref 32.0–36.0)
MCV: 96.8 fL (ref 80.0–100.0)
MPV: 12.3 fL (ref 7.5–12.5)
Platelets: 127 10*3/uL — ABNORMAL LOW (ref 140–400)
RBC: 4.73 10*6/uL (ref 4.20–5.80)
RDW: 13.8 % (ref 11.0–15.0)
WBC: 4.8 10*3/uL (ref 3.8–10.8)

## 2023-05-20 LAB — PSA: PSA: 3.45 ng/mL (ref <=4.00)

## 2023-05-20 LAB — VITAMIN D 25 HYDROXY (VIT D DEFICIENCY, FRACTURES): Vit D, 25-Hydroxy: 78 ng/mL (ref 30–100)

## 2023-05-20 LAB — TSH: TSH: 3.32 m[IU]/L (ref 0.40–4.50)

## 2023-07-13 ENCOUNTER — Other Ambulatory Visit: Payer: Self-pay | Admitting: Medical

## 2023-07-13 NOTE — Telephone Encounter (Signed)
Prescription refill request for Eliquis received. Indication:afib Last office visit:5/24 Scr:2.9  6/24 Age: 67 Weight:109.3  kg  Prescription refilled

## 2023-10-25 DIAGNOSIS — N184 Chronic kidney disease, stage 4 (severe): Secondary | ICD-10-CM | POA: Insufficient documentation

## 2023-10-25 NOTE — Progress Notes (Unsigned)
  Cardiology Office Note:   Date:  10/25/2023  ID:  [name], DOB 1956/10/06, MRN [id number] PCP: [name], MD  [facility] Providers Cardiologist:  Rollene Rotunda, MD {  History of Present Illness:   [name] is a 67 y.o. male who presented to the ED for evaluation of hypertension.   This was in Sept.  I reviewed these records for this visit.  His blood pressure when he presented on 9/12 was 200/100.  While in the emergency room he was found to have atrial flutter.  The patient was started on Cardizem for management of blood pressure as well as his heart rhythm.  He was also started on Eliquis.  He did wear a monitor and he had well-controlled heart rate with persistent atrial fibrillation.  He had no significant findings on his echocardiogram.    ***   ***He has not noticed his atrial fibrillation. The patient denies any new symptoms such as chest discomfort, neck or arm discomfort. There has been no new shortness of breath, PND or orthopnea. There have been no reported palpitations, presyncope or syncope.       ROS: ***  Studies Reviewed:    EKG:       ***  Risk Assessment/Calculations:   {Does this patient have ATRIAL FIBRILLATION?:[id number]} No BP recorded.  {Refresh Note OR Click here to enter BP  :1}***        Physical Exam:   VS:  There were no vitals taken for this visit.   Wt Readings from Last 3 Encounters:  04/20/23 241 lb (109.3 kg)  10/21/22 239 lb (108.4 kg)  09/18/22 234 lb 6.4 oz (106.3 kg)     GEN: Well nourished, well developed in no acute distress NECK: No JVD; No carotid bruits CARDIAC: ***RR, *** murmurs, rubs, gallops RESPIRATORY:  Clear to auscultation without rales, wheezing or rhonchi  ABDOMEN: Soft, non-tender, non-distended EXTREMITIES:  No edema; No deformity   ASSESSMENT AND PLAN:   Atrial flutter/fibrillation:   ***  Patient tolerates anticoagulation.  He has good rate control.  He does not notice his atrial  fibrillation.  I had a discussion with him about further management to include ablation and he and his spouse would prefer conservative therapy with rate control and anticoagulation.  CHA2DS2-VASc is 3.    HTN: Blood pressure is ***  controlled.  No change in therapy.    CKD IV:   ***  He has followed with Dr. Kathrene Bongo.       Follow up ***  Signed, Rollene Rotunda, MD

## 2023-10-28 ENCOUNTER — Ambulatory Visit: Payer: 59 | Attending: Cardiology | Admitting: Cardiology

## 2023-10-28 DIAGNOSIS — I10 Essential (primary) hypertension: Secondary | ICD-10-CM

## 2023-10-28 DIAGNOSIS — I4892 Unspecified atrial flutter: Secondary | ICD-10-CM

## 2023-10-28 DIAGNOSIS — N184 Chronic kidney disease, stage 4 (severe): Secondary | ICD-10-CM

## 2023-10-29 ENCOUNTER — Encounter: Payer: Self-pay | Admitting: Cardiology

## 2023-12-27 ENCOUNTER — Other Ambulatory Visit: Payer: Self-pay | Admitting: Cardiology

## 2024-02-01 ENCOUNTER — Other Ambulatory Visit: Payer: Self-pay | Admitting: Medical

## 2024-02-02 NOTE — Telephone Encounter (Signed)
 Prescription refill request for Eliquis received. Indication:afib Last office visit:5/24 Scr:2.90  6/24 Age: 68 Weight:109.3  kg  Prescription refilled

## 2024-02-08 DIAGNOSIS — I482 Chronic atrial fibrillation, unspecified: Secondary | ICD-10-CM | POA: Insufficient documentation

## 2024-02-08 NOTE — Progress Notes (Unsigned)
  Cardiology Office Note:   Date:  02/10/2024  ID:  [name], DOB [birth year], MRN [id number] PCP: [name], MD  [facility] Providers Cardiologist:  Rollene Rotunda, MD {  History of Present Illness:   [name] is a 68 y.o. male who presented to the ED for evaluation of hypertension.   This was in Sept.  I reviewed these records for this visit.  His blood pressure when he presented on 9/12 was 200/100.  While in the emergency room he was found to have atrial flutter.  The patient was started on Cardizem for management of blood pressure as well as his heart rhythm.  He was also started on Eliquis.   He did wear a monitor and he had well-controlled heart rate with persistent atrial fibrillation.  He had no significant findings on his echocardiogram.  Since I saw him he at some point converted back to sinus rhythm.  He does not feel fibrillation.  I do see that his blood sugar is not well-controlled and he had his insulin adjusted.  He mentions that his Cardizem was reduced to every other day but I cannot see these records.  This was done by nephrology.  They are following him and his creatinine remains elevated but stable.  He has had no new cardiovascular symptoms.  He denies any palpitations, presyncope or syncope.  He said no chest pressure, neck or arm discomfort.  She had no weight gain or edema.  ROS: As stated in the HPI and negative for all other systems.  Studies Reviewed:    EKG:         Risk Assessment/Calculations:    CHA2DS2-VASc Score = 2   This indicates a 2.2% annual risk of stroke. The patient's score is based upon: CHF History: 0 HTN History: 1 Diabetes History: 0 Stroke History: 0 Vascular Disease History: 0 Age Score: 1 Gender Score: 0   Physical Exam:   VS:  BP 116/70 (BP Location: Left Arm, Patient Position: Sitting, Cuff Size: Large)   Pulse (!) 52   Ht 5\' 10"  (1.778 m)   Wt 234 lb (106.1 kg)   SpO2 97%   BMI 33.58 kg/m    Wt  Readings from Last 3 Encounters:  02/10/24 234 lb (106.1 kg)  04/20/23 241 lb (109.3 kg)  10/21/22 239 lb (108.4 kg)     GEN: Well nourished, well developed in no acute distress NECK: No JVD; No carotid bruits CARDIAC: RRR, no murmurs, rubs, gallops RESPIRATORY:  Clear to auscultation without rales, wheezing or rhonchi  ABDOMEN: Soft, non-tender, non-distended EXTREMITIES:  No edema; No deformity   ASSESSMENT AND PLAN:   Atrial flutter/fibrillation:   He has had paroxysmal fibrillation/flutter and is in sinus today.  No change in therapy.  He tolerates anticoagulation.   HTN: Blood pressure is controlled.  No change in therapy.  I will verify his Cardizem dose with his nephrologist.   CKD IV:  Creat is 2.84.   He has followed with Dr. Kathrene Bongo.  DM:  A1c was 8.7.  He has had his meds adjusted as above and I reiterated how important blood sugar control is.     Follow up with me in 1 year  Signed, Rollene Rotunda, MD

## 2024-02-10 ENCOUNTER — Ambulatory Visit: Payer: 59 | Attending: Cardiology | Admitting: Cardiology

## 2024-02-10 ENCOUNTER — Encounter: Payer: Self-pay | Admitting: Cardiology

## 2024-02-10 VITALS — BP 116/70 | HR 52 | Ht 70.0 in | Wt 234.0 lb

## 2024-02-10 DIAGNOSIS — I482 Chronic atrial fibrillation, unspecified: Secondary | ICD-10-CM

## 2024-02-10 DIAGNOSIS — N184 Chronic kidney disease, stage 4 (severe): Secondary | ICD-10-CM

## 2024-02-10 DIAGNOSIS — I10 Essential (primary) hypertension: Secondary | ICD-10-CM | POA: Diagnosis not present

## 2024-02-10 NOTE — Patient Instructions (Signed)
 Medication Instructions:  No changes made. *If you need a refill on your cardiac medications before your next appointment, please call your pharmacy.   Follow-Up: At [facility], you and your health needs are our priority.  As part of our continuing mission to provide you with exceptional heart care, we have created designated Provider Care Teams.  These Care Teams include your primary Cardiologist (physician) and Advanced Practice Providers (APPs -  Physician Assistants and Nurse Practitioners) who all work together to provide you with the care you need, when you need it.  We recommend signing up for the patient portal called "MyChart".  Sign up information is provided on this After Visit Summary.  MyChart is used to connect with patients for Virtual Visits (Telemedicine).  Patients are able to view lab/test results, encounter notes, upcoming appointments, etc.  Non-urgent messages can be sent to your provider as well.   To learn more about what you can do with MyChart, go to [email or web address].    Your next appointment:   1 year(s)  Provider:   Rollene Rotunda, MD     Other Instructions

## 2024-08-23 ENCOUNTER — Telehealth: Payer: Self-pay

## 2024-08-23 NOTE — Telephone Encounter (Signed)
   Pre-operative Risk Assessment    Patient Name: [name]  DOB: 10/24/1956 MRN: [id number]   Date of last office visit: 02/10/24 LYNWOOD SCHILLING, MD Date of next office visit: NONE   Request for Surgical Clearance    Procedure:  COLONOSCOPY/ ENDOSCOPY  Date of Surgery:  Clearance 09/21/24                                Surgeon:  DR JERRELL SOL Surgeon's Group or Practice Name:  [facility] Phone number:  [phone] Fax number:  [phone]   Type of Clearance Requested:   - Medical  - Pharmacy:  Hold Apixaban  (Eliquis ) 1-2 DAYS PRIOR   Type of Anesthesia:  PROPOFOL   Additional requests/questions:    Signed, Lucie DELENA Ku   08/23/2024, 1:17 PM

## 2024-08-27 NOTE — Telephone Encounter (Signed)
 Patient with diagnosis of atrial fibrillation on Eliquis  for anticoagulation.    Procedure:  COLONOSCOPY/ ENDOSCOPY   Date of Surgery:  Clearance 09/21/24     CHA2DS2-VASc Score = 3   This indicates a 3.2% annual risk of stroke. The patient's score is based upon: CHF History: 0 HTN History: 1 Diabetes History: 1 Stroke History: 0 Vascular Disease History: 0 Age Score: 1 Gender Score: 0    CrCl 37 Platelet count 127  Patient has not had an Afib/aflutter ablation or Watchman within the last 3 months or DCCV within the last 30 days   Per office protocol, patient can hold Eliquis  for 2 days prior to procedure.   Patient will not need bridging with Lovenox (enoxaparin) around procedure.  **This guidance is not considered finalized until pre-operative APP has relayed final recommendations.**

## 2024-08-28 NOTE — Telephone Encounter (Signed)
 1st attempt to reach pt regarding surgical clearance and the need for an TELE appointment.  Left pt a detailed message to call back and get that scheduled.

## 2024-08-28 NOTE — Telephone Encounter (Signed)
   Name: [name]  DOB: Aug 12, 1956  MRN: [id number]  Primary Cardiologist: Lynwood Schilling, MD   Preoperative team, please contact this patient and set up a phone call appointment for further preoperative risk assessment. Please obtain consent and complete medication review. Thank you for your help.  I confirm that guidance regarding antiplatelet and oral anticoagulation therapy has been completed and, if necessary, noted below.  Patient has not had an Afib/aflutter ablation or Watchman within the last 3 months or DCCV within the last 30 days    Per office protocol, patient can hold Eliquis  for 2 days prior to procedure.   Patient will not need bridging with Lovenox (enoxaparin) around procedure.  I also confirmed the patient resides in the state of [place] . As per [facility] Medical Board telemedicine laws, the patient must reside in the state in which the provider is licensed.    Barnie Hila, NP 08/28/2024, 2:20 PM [facility]

## 2024-08-29 ENCOUNTER — Other Ambulatory Visit: Payer: Self-pay | Admitting: Medical

## 2024-08-29 ENCOUNTER — Telehealth: Payer: Self-pay

## 2024-08-29 NOTE — Telephone Encounter (Signed)
 Prescription refill request for Eliquis  received. Indication:afib Last office visit:3/25 Scr:3.08  2025 Age: 68 Weight:106.1  kg  Prescription refilled

## 2024-08-29 NOTE — Telephone Encounter (Signed)
 Appointment scheduled for 09/01/2024 @ 9:40. Med req and consent are complete. Call wife's number at [phone].

## 2024-08-29 NOTE — Telephone Encounter (Signed)
  Patient Consent for Virtual Visit         [name] has provided verbal consent on 08/29/2024 for a virtual visit (video or telephone).  Appointment scheduled for Med req and consent are complete. Call wife's number at [phone].    CONSENT FOR VIRTUAL VISIT FOR:  [name]  By participating in this virtual visit I agree to the following:  I hereby voluntarily request, consent and authorize [facility] and its employed or contracted physicians, physician assistants, nurse practitioners or other licensed health care professionals (the Practitioner), to provide me with telemedicine health care services (the "Services) as deemed necessary by the treating Practitioner. I acknowledge and consent to receive the Services by the Practitioner via telemedicine. I understand that the telemedicine visit will involve communicating with the Practitioner through live audiovisual communication technology and the disclosure of certain medical information by electronic transmission. I acknowledge that I have been given the opportunity to request an in-person assessment or other available alternative prior to the telemedicine visit and am voluntarily participating in the telemedicine visit.  I understand that I have the right to withhold or withdraw my consent to the use of telemedicine in the course of my care at any time, without affecting my right to future care or treatment, and that the Practitioner or I may terminate the telemedicine visit at any time. I understand that I have the right to inspect all information obtained and/or recorded in the course of the telemedicine visit and may receive copies of available information for a reasonable fee.  I understand that some of the potential risks of receiving the Services via telemedicine include:  Delay or interruption in medical evaluation due to technological equipment failure or disruption; Information transmitted may not be  sufficient (e.g. poor resolution of images) to allow for appropriate medical decision making by the Practitioner; and/or  In rare instances, security protocols could fail, causing a breach of personal health information.  Furthermore, I acknowledge that it is my responsibility to provide information about my medical history, conditions and care that is complete and accurate to the best of my ability. I acknowledge that Practitioner's advice, recommendations, and/or decision may be based on factors not within their control, such as incomplete or inaccurate data provided by me or distortions of diagnostic images or specimens that may result from electronic transmissions. I understand that the practice of medicine is not an exact science and that Practitioner makes no warranties or guarantees regarding treatment outcomes. I acknowledge that a copy of this consent can be made available to me via my patient portal [facility] MyChart), or I can request a printed copy by calling the office of [facility].    I understand that my insurance will be billed for this visit.   I have read or had this consent read to me. I understand the contents of this consent, which adequately explains the benefits and risks of the Services being provided via telemedicine.  I have been provided ample opportunity to ask questions regarding this consent and the Services and have had my questions answered to my satisfaction. I give my informed consent for the services to be provided through the use of telemedicine in my medical care

## 2024-08-31 NOTE — Progress Notes (Unsigned)
 Virtual Visit via Telephone Note   Because of [name] co-morbid illnesses, he is at least at moderate risk for complications without adequate follow up.  This format is felt to be most appropriate for this patient at this time.  Due to technical limitations with video connection (technology), today's appointment will be conducted as an audio only telehealth visit, and [name] verbally agreed to proceed in this manner.   All issues noted in this document were discussed and addressed.  No physical exam could be performed with this format.  Evaluation Performed:  Preoperative cardiovascular risk assessment _____________   Date:  08/31/2024   Patient ID:  [name], DOB Feb 28, 1956, MRN [id number] Patient Location:  Home Provider location:   Office  Primary Care Provider:  Shelda Atlas, MD Primary Cardiologist:  Lynwood Schilling, MD  Chief Complaint / Patient Profile   68 y.o. y/o male with a h/o atrial fibrillation, hypertension, stage IV CKD who is pending colonoscopy/endoscopy and presents today for telephonic preoperative cardiovascular risk assessment.  History of Present Illness    [name] is a 68 y.o. male who presents via audio/video conferencing for a telehealth visit today.  Pt was last seen in cardiology clinic on 02/10/2024 by Dr. Schilling.  At that time [name] was doing well .  The patient is now pending procedure as outlined above. Since his last visit, he continues to be stable from a cardiac standpoint.  Today he denies chest pain, shortness of breath, lower extremity edema, fatigue, palpitations, melena, hematuria, hemoptysis, diaphoresis, weakness, presyncope, syncope, orthopnea, and PND.   Past Medical History    Past Medical History:  Diagnosis Date   Atrial fib    Bipolar affective (HCC)    DM (diabetes mellitus) (HCC)    Hyperlipemia    Hypertension    Past Surgical History:  Procedure Laterality Date   None       Allergies  No Known Allergies  Home Medications    Prior to Admission medications   Medication Sig Start Date End Date Taking? Authorizing Provider  allopurinol (ZYLOPRIM) 100 MG tablet Take 100 mg by mouth in the morning and at bedtime.    [provider]  benazepril (LOTENSIN) 20 MG tablet Take 20 mg by mouth daily.    [provider]  calcitRIOL (ROCALTROL) 0.25 MCG capsule Take 0.25 mcg by mouth daily. Patient not taking: Reported on 02/10/2024    [provider]  diltiazem  (CARDIZEM  CD) 120 MG 24 hr capsule TAKE 1 CAPSULE(120 MG) BY MOUTH DAILY 12/29/23   Schilling Lynwood, MD  ELIQUIS  5 MG TABS tablet TAKE 1 TABLET(5 MG) BY MOUTH TWICE DAILY 08/29/24   Furth, Cadence H, PA-C  empagliflozin (JARDIANCE) 25 MG TABS tablet Take 25 mg by mouth daily.    [provider]  ergocalciferol  (VITAMIN D2) 1.25 MG (50000 UT) capsule Take 50,000 Units by mouth every Saturday.    [provider]  furosemide (LASIX) 40 MG tablet Take 40 mg by mouth in the morning.    [provider]  glimepiride (AMARYL) 4 MG tablet Take 4 mg by mouth daily.    [provider]  HUMALOG MIX 75/25 KWIKPEN (75-25) 100 UNIT/ML KwikPen Inject 60 Units into the skin in the morning and at bedtime. 07/06/22   [provider]  Potassium Chloride ER 20 MEQ TBCR Take 20 mEq by mouth every Monday, Wednesday, and Friday. 07/13/22   [provider]  simvastatin (ZOCOR)  20 MG tablet Take 20 mg by mouth at bedtime.    [provider]  ziprasidone (GEODON) 40 MG capsule Take 40 mg by mouth at bedtime.    [provider]    Physical Exam    Vital Signs:  [name] does not have vital signs available for review today.  Given telephonic nature of communication, physical exam is limited. AAOx3. NAD. Normal affect.  Speech and respirations are unlabored.  Accessory Clinical Findings    None  Assessment & Plan    1.   Preoperative Cardiovascular Risk Assessment: COLONOSCOPY/ ENDOSCOPY   Date of Surgery:  Clearance 09/21/24                                  Surgeon:  DR JERRELL SOL Surgeon's Group or Practice Name:  [facility] Phone number:  [phone] Fax number:  [phone]      Primary Cardiologist: Lynwood Schilling, MD  Chart reviewed as part of pre-operative protocol coverage. Given past medical history and time since last visit, based on ACC/AHA guidelines, [name] would be at acceptable risk for the planned procedure without further cardiovascular testing.   Patient was advised that if he develops new symptoms prior to surgery to contact our office to arrange a follow-up appointment.  He verbalized understanding.  Per office protocol, patient can hold Eliquis  for 2 days prior to procedure.   Patient will not need bridging with Lovenox (enoxaparin) around procedure.  I will route this recommendation to the requesting party via Epic fax function and remove from pre-op pool.       Time:   Today, I have spent 5 minutes with the patient with telehealth technology discussing medical history, symptoms, and management plan.  I spent 10 minutes reviewing patient's past cardiac history and cardiac medications.    Josefa CHRISTELLA Beauvais, NP  08/31/2024, 4:09 PM

## 2024-09-01 ENCOUNTER — Ambulatory Visit

## 2024-09-01 DIAGNOSIS — Z01810 Encounter for preprocedural cardiovascular examination: Secondary | ICD-10-CM | POA: Diagnosis not present
